# Patient Record
Sex: Female | Born: 1975
Health system: Southern US, Community
[De-identification: ages and names within clinical notes are randomized; demographics above are authoritative.]

## PROBLEM LIST (undated history)

## (undated) DIAGNOSIS — R51 Headache: Secondary | ICD-10-CM

## (undated) DIAGNOSIS — F32A Depression, unspecified: Secondary | ICD-10-CM

## (undated) DIAGNOSIS — F419 Anxiety disorder, unspecified: Secondary | ICD-10-CM

## (undated) DIAGNOSIS — R011 Cardiac murmur, unspecified: Secondary | ICD-10-CM

## (undated) DIAGNOSIS — F329 Major depressive disorder, single episode, unspecified: Secondary | ICD-10-CM

## (undated) DIAGNOSIS — IMO0001 Reserved for inherently not codable concepts without codable children: Secondary | ICD-10-CM

## (undated) DIAGNOSIS — D649 Anemia, unspecified: Secondary | ICD-10-CM

## (undated) DIAGNOSIS — N809 Endometriosis, unspecified: Secondary | ICD-10-CM

## (undated) DIAGNOSIS — T7840XA Allergy, unspecified, initial encounter: Secondary | ICD-10-CM

## (undated) DIAGNOSIS — R519 Headache, unspecified: Secondary | ICD-10-CM

## (undated) HISTORY — PX: WISDOM TOOTH EXTRACTION: SHX21

## (undated) HISTORY — PX: TUBAL LIGATION: SHX77

## (undated) HISTORY — PX: CHOLECYSTECTOMY: SHX55

## (undated) HISTORY — DX: Allergy, unspecified, initial encounter: T78.40XA

## (undated) HISTORY — PX: DIAGNOSTIC LAPAROSCOPY: SUR761

---

## 1999-03-05 ENCOUNTER — Other Ambulatory Visit: Admission: RE | Admit: 1999-03-05 | Discharge: 1999-03-05 | Payer: Self-pay | Admitting: Obstetrics and Gynecology

## 1999-05-23 ENCOUNTER — Inpatient Hospital Stay (HOSPITAL_COMMUNITY): Admission: AD | Admit: 1999-05-23 | Discharge: 1999-05-25 | Payer: Self-pay | Admitting: Obstetrics and Gynecology

## 1999-08-15 ENCOUNTER — Other Ambulatory Visit: Admission: RE | Admit: 1999-08-15 | Discharge: 1999-08-15 | Payer: Self-pay | Admitting: Obstetrics and Gynecology

## 2005-07-31 ENCOUNTER — Emergency Department (HOSPITAL_COMMUNITY): Admission: EM | Admit: 2005-07-31 | Discharge: 2005-07-31 | Payer: Self-pay | Admitting: Emergency Medicine

## 2006-07-04 ENCOUNTER — Encounter: Admission: RE | Admit: 2006-07-04 | Discharge: 2006-07-04 | Payer: Self-pay | Admitting: Cardiology

## 2006-08-14 ENCOUNTER — Ambulatory Visit (HOSPITAL_COMMUNITY): Admission: RE | Admit: 2006-08-14 | Discharge: 2006-08-14 | Payer: Self-pay | Admitting: General Surgery

## 2006-08-14 ENCOUNTER — Encounter (INDEPENDENT_AMBULATORY_CARE_PROVIDER_SITE_OTHER): Payer: Self-pay | Admitting: Specialist

## 2007-06-09 ENCOUNTER — Ambulatory Visit (HOSPITAL_COMMUNITY): Admission: RE | Admit: 2007-06-09 | Discharge: 2007-06-09 | Payer: Self-pay | Admitting: Obstetrics and Gynecology

## 2010-11-04 ENCOUNTER — Encounter: Payer: Self-pay | Admitting: Cardiology

## 2011-02-26 NOTE — Op Note (Signed)
Julie Dunn, Julie Dunn NO.:  [id number]   MEDICAL RECORD NO.:  [id number]          PATIENT TYPE:  AMB   LOCATION:  SDC                           FACILITY:  [facility]   PHYSICIAN:  Miguel Aschoff, M.D.       DATE OF BIRTH:  [birth year]   DATE OF PROCEDURE:  06/09/2007  DATE OF DISCHARGE:                               OPERATIVE REPORT   PREOPERATIVE DIAGNOSES:  Pelvic pain secondary to dysmenorrhea, desired  sterilization.   POSTOPERATIVE DIAGNOSES:  Pelvic pain secondary to dysmenorrhea, desired  sterilization, pelvic endometriosis.   PROCEDURE:  Diagnostic laparoscopy, laser ablation of endometriosis,  laser ablation of the uterosacral nerves followed by bipolar cautery of  fallopian tubes for sterilization.   SURGEON:  Dr. Miguel Aschoff.   ANESTHESIA:  General.   COMPLICATIONS:  None.   JUSTIFICATION:  The patient is a 35 year old, black female with a  history of progressive dysmenorrhea. The patient has requested that a  sterilization procedure be carried out at the time that a diagnostic  laparoscopy is performed to assess the etiology of her dysmenorrhea.  The risks and benefits of these procedures has been obtained and  informed consent has been obtained for sterilization.   DESCRIPTION OF PROCEDURE:  The patient was taken to the operating room,  placed in the supine position, general anesthesia was administered  without difficulty.  She was then placed in the dorsal lithotomy  position, prepped and draped in the usual sterile fashion.  The bladder  was catheterized.  At this point, a Hulka tenaculum was placed through  the cervix and held and then attention was directed to the umbilicus  where a small infraumbilical incision was made.  A Veress needle was  then inserted and the abdomen was insufflated with 3 liters of CO2.  Following the insufflation, the trocar to the laparoscope was placed  followed by the laparoscope itself.  Then under direct visualization,  a  second midline puncture was performed to allow a 5-mm port.  Once this  was established, systematic inspection of the pelvic organs was carried  out. The anterior bladder peritoneum was unremarkable.  The round  ligaments were inspected and were normal along their course.  There was  no sign of any inguinal hernias present.  The uterus was noted be  globular, top normal size, the surface was otherwise unremarkable.  At  this point, the tubes were traced along their course, they were normal  bilaterally and no adhesions were noted.  The fimbriae were fine and  delicate.  The ovaries appeared to be within normal limits.  A corpus  luteum was noted on the left ovary, no other abnormalities were noted.  Attention was then directed to the cul-de-sac. In the cul-de-sac deep  within the pelvis, there was a peritoneal window consistent with  endometriosis and within this window was an implanted endometriosis. In  addition, the uterosacral nerve on the left side appeared to be  thickened again consistent with endometriosis. No other implants were  noted.  No other abnormalities were noted in the pelvis.  The intestinal  surfaces were unremarkable.  The liver was inspected and appeared to be  within normal limits.  At this point, the YAG laser was passed through  the operating channel of the laparoscope and with a GRP6 and 15 watts of  power the endometriosis in the cul-de-sac was ablated without  difficulty. In effort to decrease the dysmenorrhea, the uterosacral  nerves were partially transected at their junction with the uterus.  Care was taken to avoid any injury to adjacent structures of the  ureters. This was done successfully.  After this was done, the bipolar  cautery forceps were introduced, the mid portion of each tube was then  grasped, cauterized for approximately 3 cm and then divided in the  midline. Over the area of cauterization was good separation and good  hemostasis. At this  point with no other abnormalities being noted and  the source of the dysmenorrhea confirmed, it was elected to complete the  procedure. CO2 was allowed to escape, all instruments were removed and  then the small incisions were closed using subcuticular 3-0 Vicryl.  The  port sites were then injected with a total of 10 mL of 1% Xylocaine.  The patient was then reversed from the anesthetic and taken to the  recovery room in satisfactory condition.  There was minimal blood loss  from the procedure.   The patient is to be discharged home.  Medications for home include  Tylox 1 every 3 hours as needed for pain, doxycycline 100 mg twice a day  x3 days.  The patient is to set a follow-up visit up for 4 weeks.  She  is to call for any problems such as fever, pain or heavy bleeding and it  was recommended that she call on August 28 to discuss the findings at  the time of laparoscopy.      Miguel Aschoff, M.D.  Electronically Signed     AR/MEDQ  D:  06/09/2007  T:  06/10/2007  Job:  [id number]

## 2011-03-01 NOTE — Op Note (Signed)
NAMEJAYNI, Julie Dunn NO.:  [id number]   MEDICAL RECORD NO.:  [id number]          PATIENT TYPE:  AMB   LOCATION:  SDS                          FACILITY:  [facility]   PHYSICIAN:  Leonie Man, M.D.   DATE OF BIRTH:  08/08/1976   DATE OF PROCEDURE:  08/14/2006  DATE OF DISCHARGE:                                 OPERATIVE REPORT   PREOPERATIVE DIAGNOSIS:  Chronic calculus cholecystitis.   POSTOPERATIVE DIAGNOSIS:  Chronic calculus cholecystitis.   PROCEDURE:  Laparoscopic cholecystectomy with intraoperative cholangiogram.   SURGEON:  Leonie Man, MD.   ASSISTANT:  Velora Heckler, MD.   ANESTHESIA:  General.   SPECIMENS TO THE LAB:  Gallbladder with stones.   ESTIMATED BLOOD LOSS:  Minimal.   COMPLICATIONS:  None.   DISPOSITION:  The patient returned to the PACU in excellent condition.   INDICATIONS:  This is a 35 year old female, who presents with a severe  episode of right upper quadrant abdominal pain associated with nausea and  with emesis.  She is evaluated by gallbladder ultrasonography and noted to  have sludge and multiple gallstones.  Her liver function studies are all  within normal limits.  She comes to the operating room now after the risks  and benefits of surgery have been fully discussed, all questions answered  and consent obtained.   DESCRIPTION OF PROCEDURE:  Following the induction of satisfactory general  anesthesia, the patient was positioned supine and the abdomen was prepped  and draped to be included in the sterile operative field.  Open laparoscopy  was created at the umbilicus with insertion of a Hasson cannula and  insufflation of the peritoneal cavity to 14 mmHg pressure using carbon  dioxide.  A camera was inserted and visual exploration of the abdomen  carried out.  The gallbladder was noted to be chronically scarred.  The  liver edges are sharp.  The liver surface is smooth.  None of the large or  small intestine appeared  to be abnormal.  The anterior gastric wall and the  duodenal sweep were also normal.  Under direct vision, epigastric and  lateral ports were placed.  The gallbladder was grasped and retracted  cephalad.  The dissection was carried down in the region of the ampulla to  allow the isolation of the cystic artery and cystic duct.  The cystic artery  was traced up to its entry into the gallbladder wall.  The cystic duct was  traced up to the cystic duct-gallbladder junction, and the common bile duct  could be clearly seen below.  The cystic duct was clipped proximally and  opened, and a cystic duct cholangiogram was carried out by placing a Cook  catheter into the abdomen and into the cystic duct.  The cholangiogram using  1/2 strength Hypaque was accomplished under fluoroscopic guidance.  The  resulting films showed a prompt flow of contrast into the duodenum, with  normal tapering of the distal common bile duct.  There were no filling  defects noted.  Both the right and left hepatic radicals appeared to be  normal, as  well as the caliber of the common and hepatic ducts.  The  cholangiocatheter was then removed and the cystic duct triply clipped and  transected.  Dissection was carried along the gallbladder, releasing the  gallbladder from the liver bed using electrocautery, and maintaining  hemostasis throughout the entire course of the dissection.  At the end of  the dissection, the gallbladder was placed in an Endopouch and retrieved  through the umbilical port without difficulty.  The right upper quadrant was  then thoroughly irrigated with normal saline and aspirated free.  All areas  of bleeding in the liver bed were treated with additional pulses of  electrocautery.  Sponge and instrument counts were then verified.  The  pneumoperitoneum was released after the lateral ports were removed under  direct vision.  The wounds were then closed in layers as follows: the  umbilical wound in 2  layers with 0 Vicryl and 4-0 Monocryl.  Epigastric and  flank wounds were closed with 4-0 Monocryl sutures.  All wounds were  reinforced with Steri-Strips.  Sterile dressings were applied, the  anesthetic reversed and the patient removed from the operating room to the  recovery room in stable condition.  She tolerated the procedure well.      Leonie Man, M.D.  Electronically Signed     PB/MEDQ  D:  08/14/2006  T:  08/15/2006  Job:  [id number]   cc:   Leonie Man, MD

## 2011-07-26 LAB — CBC
HCT: 35.2 — ABNORMAL LOW
Hemoglobin: 11.6 — ABNORMAL LOW
MCHC: 33
MCV: 78.8
Platelets: 488 — ABNORMAL HIGH
RBC: 4.47
RDW: 15.9 — ABNORMAL HIGH
WBC: 6.8

## 2011-07-26 LAB — PREGNANCY, URINE: Preg Test, Ur: NEGATIVE

## 2014-06-01 ENCOUNTER — Ambulatory Visit (INDEPENDENT_AMBULATORY_CARE_PROVIDER_SITE_OTHER): Payer: BC Managed Care – PPO | Admitting: Family Medicine

## 2014-06-01 VITALS — BP 100/60 | HR 67 | Temp 98.1°F | Resp 16 | Ht 63.25 in | Wt 204.0 lb

## 2014-06-01 DIAGNOSIS — J309 Allergic rhinitis, unspecified: Secondary | ICD-10-CM

## 2014-06-01 DIAGNOSIS — Z111 Encounter for screening for respiratory tuberculosis: Secondary | ICD-10-CM

## 2014-06-01 DIAGNOSIS — Z0000 Encounter for general adult medical examination without abnormal findings: Secondary | ICD-10-CM

## 2014-06-01 DIAGNOSIS — G44229 Chronic tension-type headache, not intractable: Secondary | ICD-10-CM

## 2014-06-01 DIAGNOSIS — J302 Other seasonal allergic rhinitis: Secondary | ICD-10-CM

## 2014-06-01 LAB — POCT CBC
Granulocyte percent: 47.8 %G (ref 37–80)
HCT, POC: 31.8 % — AB (ref 37.7–47.9)
Hemoglobin: 9.7 g/dL — AB (ref 12.2–16.2)
Lymph, poc: 3.2 (ref 0.6–3.4)
MCH, POC: 21.4 pg — AB (ref 27–31.2)
MCHC: 30.4 g/dL — AB (ref 31.8–35.4)
MCV: 70.3 fL — AB (ref 80–97)
MID (cbc): 0.4 (ref 0–0.9)
MPV: 6.4 fL (ref 0–99.8)
POC Granulocyte: 3.3 (ref 2–6.9)
POC LYMPH PERCENT: 46.5 %L (ref 10–50)
POC MID %: 5.7 %M (ref 0–12)
Platelet Count, POC: 508 10*3/uL — AB (ref 142–424)
RBC: 4.53 M/uL (ref 4.04–5.48)
RDW, POC: 18.8 %
WBC: 6.9 10*3/uL (ref 4.6–10.2)

## 2014-06-01 NOTE — Patient Instructions (Signed)
I will let you know the results of your labs  Advise regular exercise  Advise trying to eat less and losing a little weight  Return if problems arise

## 2014-06-01 NOTE — Progress Notes (Signed)

## 2014-06-01 NOTE — Progress Notes (Signed)
Employment physical exam:  History: 38 year old lady who is here for a physical examination. She has no major acute medical complaints.  Past medical history: Medications: None Allergies: None Medical illnesses: Seasonal allergies Surgical history: Cholecystectomy, tubal ligation, and laparoscopic endometriosis surgery Gravida 2 para 2  Family history: Mother is living and well except for history of hypertension. Father is deceased, history unknown.  Social history: Patient is married, has 2 daughters. Yesterday she put her daughter into the normal at [facility]. Patient will be in a [facility] [employer] [employer] 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 She does not smoke or use drugs. She does drink one or 2 drinks a week. Her only sexual partners her husband. She does not get regular exercise. She does have a college education from [employer].  Review of systems: Constitutional: Unremarkable, gets tired occasionally but it's probably from her lifestyle. HEENT: Unremarkable Respiratory: Unremarkable Cardiovascular: Unremarkable Gastrointestinal: Unremarkable Endocrine: Unremarkable Genitourinary: Unremarkable Musculoskeletal: Unremarkable Dermatologic: Unremarkable Allergy/immunology: Has seasonal allergies Neurological: Almost daily headaches, takes some Tylenol or Advil for them. Probably from her stressful lifestyle. Hematologic: Unremarkable Psychiatric: Unremarkable  Preventive history: Last Pap smear at 2012. Never had a mammogram. No EKGs, bone density, colonoscopy.   Physical exam: Overweight lady in no acute distress. TMs normal. Eyes PERRLA. Fundi benign. Discs flat. Throat was clear, missing some teeth. Neck supple without nodes thyromegaly. Chest clear to auscultation and percussion. No carotid bruits. No thyromegaly. Breasts are full, symmetrical, no masses. No axillary nodes. Abdomen soft without mass or tenderness. Has  a couple of old surgical scars from laparoscopic procedures. Extremities unremarkable. Skin unremarkable.  Assessment: Normal physical examination Headaches, probably muscle tension Seasonal allergies  Plan: CBC, comprehensive metabolic panel, lipids, TSH PPD skin test, to be read in 48-72 hours Form for job completed

## 2014-06-02 ENCOUNTER — Encounter: Payer: Self-pay | Admitting: *Deleted

## 2014-06-02 LAB — COMPLETE METABOLIC PANEL WITH GFR
ALT: 11 U/L (ref 0–35)
AST: 14 U/L (ref 0–37)
Albumin: 4.1 g/dL (ref 3.5–5.2)
Alkaline Phosphatase: 106 U/L (ref 39–117)
BUN: 7 mg/dL (ref 6–23)
CO2: 27 mEq/L (ref 19–32)
Calcium: 9.4 mg/dL (ref 8.4–10.5)
Chloride: 104 mEq/L (ref 96–112)
Creat: 0.86 mg/dL (ref 0.50–1.10)
GFR, Est African American: >89 mL/min
GFR, Est Non African American: 85 mL/min
Glucose, Bld: 89 mg/dL (ref 70–99)
Potassium: 4.7 mEq/L (ref 3.5–5.3)
Sodium: 137 mEq/L (ref 135–145)
Total Bilirubin: 0.4 mg/dL (ref 0.2–1.2)
Total Protein: 7.7 g/dL (ref 6.0–8.3)

## 2014-06-02 LAB — LIPID PANEL
Cholesterol: 172 mg/dL (ref 0–200)
HDL: 63 mg/dL (ref >39)
LDL Cholesterol: 98 mg/dL (ref 0–99)
Total CHOL/HDL Ratio: 2.7 Ratio
Triglycerides: 55 mg/dL (ref <150)
VLDL: 11 mg/dL (ref 0–40)

## 2014-06-02 LAB — TSH: TSH: 1.849 u[IU]/mL (ref 0.350–4.500)

## 2014-06-04 ENCOUNTER — Ambulatory Visit (INDEPENDENT_AMBULATORY_CARE_PROVIDER_SITE_OTHER): Payer: BC Managed Care – PPO

## 2014-06-04 DIAGNOSIS — Z111 Encounter for screening for respiratory tuberculosis: Secondary | ICD-10-CM

## 2014-06-04 LAB — TB SKIN TEST
Induration: 8 mm
TB Skin Test: NEGATIVE

## 2014-06-04 NOTE — Progress Notes (Signed)
   Subjective:    Patient ID: Julie Dunn, female    DOB: 1975/12/31, 38 y.o.   MRN: [id number]  HPI  Read TB test with Harrison Mons, PA-C. It was 8 mm induration. No risk factors. Negative.     Review of Systems     Objective:   Physical Exam        Assessment & Plan:

## 2014-08-23 ENCOUNTER — Ambulatory Visit (INDEPENDENT_AMBULATORY_CARE_PROVIDER_SITE_OTHER): Payer: BC Managed Care – PPO | Admitting: Family Medicine

## 2014-08-23 VITALS — BP 136/87 | HR 73 | Temp 98.6°F | Resp 20 | Ht 63.0 in | Wt 208.4 lb

## 2014-08-23 DIAGNOSIS — R109 Unspecified abdominal pain: Secondary | ICD-10-CM

## 2014-08-23 DIAGNOSIS — N390 Urinary tract infection, site not specified: Secondary | ICD-10-CM

## 2014-08-23 DIAGNOSIS — M549 Dorsalgia, unspecified: Secondary | ICD-10-CM

## 2014-08-23 DIAGNOSIS — N809 Endometriosis, unspecified: Secondary | ICD-10-CM

## 2014-08-23 LAB — POCT UA - MICROSCOPIC ONLY
Casts, Ur, LPF, POC: NEGATIVE
Crystals, Ur, HPF, POC: NEGATIVE
Mucus, UA: NEGATIVE
Yeast, UA: NEGATIVE

## 2014-08-23 LAB — POCT URINALYSIS DIPSTICK
Bilirubin, UA: NEGATIVE
Glucose, UA: NEGATIVE
Ketones, UA: NEGATIVE
Nitrite, UA: NEGATIVE
Protein, UA: NEGATIVE
Spec Grav, UA: 1.015
Urobilinogen, UA: 0.2
pH, UA: 7.5

## 2014-08-23 MED ORDER — CYCLOBENZAPRINE HCL 5 MG PO TABS
5.0000 mg | ORAL_TABLET | Freq: Every evening | ORAL | Status: DC | PRN
Start: 2014-08-23 — End: 2016-01-30

## 2014-08-23 MED ORDER — CEPHALEXIN 500 MG PO CAPS: 500.0000 mg | ORAL_CAPSULE | Freq: Two times a day (BID) | ORAL | Status: DC

## 2014-08-23 MED ORDER — HYDROCODONE-ACETAMINOPHEN 5-325 MG PO TABS: 1.0000 | ORAL_TABLET | Freq: Three times a day (TID) | ORAL | Status: DC | PRN

## 2014-08-23 NOTE — Patient Instructions (Signed)

## 2014-08-23 NOTE — Progress Notes (Signed)
Chief Complaint:  Chief Complaint  Patient presents with  . Flank Pain    left flank pain that is radiating around the left side.  hard to lay down.  did have some burning with urination today.      HPI: Julie Dunn is a 38 y.o. female who is here for  Left sided throbbing pain in the back, constant,wose layign dpwn, she has not had this before. She started having urinary sxs today dysuira. No fevers or chills. No UTI in the past.  She has nki for back. No heavy lifitn. She is supposed to starting her period. She has a hx of endometriosis. This feels little bit like her endometriosis , usually in the front. This feels different sicn ein the back.  Past Medical History  Diagnosis Date  . Allergy    Past Surgical History  Procedure Laterality Date  . Tubal ligation    . Gallbladder surgery     History   Social History  . Marital Status: Married    Spouse Name: N/A    Number of Children: N/A  . Years of Education: N/A   Social History Main Topics  . Smoking status: Never Smoker   . Smokeless tobacco: None  . Alcohol Use: None  . Drug Use: None  . Sexual Activity: None   Other Topics Concern  . None   Social History Narrative   Family History  Problem Relation Age of Onset  . Hypertension Mother   . Hypertension Maternal Grandmother    No Known Allergies Prior to Admission medications   Not on File   ROS: The patient denies fevers, chills, night sweats, unintentional weight loss, chest pain, palpitations, wheezing, dyspnea on exertion, nausea, vomiting, abdominal pain, dysuria, hematuria, melena, numbness, weakness, or tingling.   All other systems have been reviewed and were otherwise negative with the exception of those mentioned in the HPI and as above.    PHYSICAL EXAM: Filed Vitals:   08/23/14 2007  BP: 136/87  Pulse: 73  Temp: 98.6 F (37 C)  Resp: 20   Filed Vitals:   08/23/14 2007  Height: 5\' 3"  (1.6 m)  Weight: 208 lb 6.4 oz (94.53  kg)   Body mass index is 36.93 kg/(m^2).  General: Alert, no acute distress HEENT:  Normocephalic, atraumatic, oropharynx patent. EOMI, PERRLA Cardiovascular:  Regular rate and rhythm, no rubs murmurs or gallops.  No Carotid bruits, radial pulse intact. No pedal edema.  Respiratory: Clear to auscultation bilaterally.  No wheezes, rales, or rhonchi.  No cyanosis, no use of accessory musculature GI: No organomegaly, abdomen is soft and non-tender, positive bowel sounds.  No masses. Skin: No rashes. Neurologic: Facial musculature symmetric. Psychiatric: Patient is appropriate throughout our interaction. Lymphatic: No cervical lymphadenopathy Musculoskeletal: Gait intact. + left CVA tenderness Neg scoliosis  Full ROM 5/5 strength, 2/2 DTRs No saddle anesthesia Straight leg negative Hip and knee exam--normal    LABS: Results for orders placed or performed in visit on 08/23/14  POCT UA - Microscopic Only  Result Value Ref Range   WBC, Ur, HPF, POC 12-18 with small clusters    RBC, urine, microscopic 4-6    Bacteria, U Microscopic small    Mucus, UA neg    Epithelial cells, urine per micros 4-8    Crystals, Ur, HPF, POC neg    Casts, Ur, LPF, POC neg    Yeast, UA neg   POCT urinalysis dipstick  Result Value Ref Range  Color, UA yellow    Clarity, UA hazy    Glucose, UA neg    Bilirubin, UA neg    Ketones, UA neg    Spec Grav, UA 1.015    Blood, UA moderate    pH, UA 7.5    Protein, UA neg    Urobilinogen, UA 0.2    Nitrite, UA neg    Leukocytes, UA small (1+)      EKG/XRAY:   Primary read interpreted by Dr. Marin Comment at [facility].   ASSESSMENT/PLAN: Encounter Diagnoses  Name Primary?  . Left flank pain   . UTI (lower urinary tract infection) Yes  . Left-sided back pain, unspecified location   . Endometriosis    Keflex Flexeril Norco Urine cx pending  Gross sideeffects, risk and benefits, and alternatives of medications d/w patient. Patient is aware that all  medications have potential sideeffects and we are unable to predict every sideeffect or drug-drug interaction that may occur.  LE, [place], DO 08/23/2014 9:18 PM

## 2014-08-25 LAB — URINE CULTURE: Colony Count: 30000

## 2015-07-11 ENCOUNTER — Other Ambulatory Visit: Payer: Self-pay | Admitting: Obstetrics & Gynecology

## 2015-07-12 LAB — CYTOLOGY - PAP

## 2015-07-26 ENCOUNTER — Encounter (HOSPITAL_COMMUNITY): Payer: Self-pay

## 2015-12-26 ENCOUNTER — Other Ambulatory Visit: Payer: Self-pay | Admitting: Obstetrics and Gynecology

## 2016-01-29 NOTE — Patient Instructions (Signed)
Your procedure is scheduled on:  Wednesday, Feb 14, 2016  Enter through the [employer] [facility] at: 7:00 AM  Pick up the phone at the desk and dial [phone].  Call this number if you have problems the morning of surgery: [phone].  Remember:  Do NOT eat food or drink after:  Midnight Tuesday, Feb 13, 2016  Take these medicines the morning of surgery with a SIP OF WATER:  None  Do NOT wear jewelry (body piercing), metal hair clips/bobby pins, make-up, or nail polish. Do NOT wear lotions, powders, or perfumes.  You may wear deodorant. Do NOT shave for 48 hours prior to surgery. Do NOT bring valuables to the hospital. Contacts, dentures, or bridgework may not be worn into surgery.  Leave suitcase in car.  After surgery it may be brought to your room.  For patients admitted to the hospital, checkout time is 11:00 AM the day of discharge.

## 2016-01-30 ENCOUNTER — Encounter (HOSPITAL_COMMUNITY)
Admission: RE | Admit: 2016-01-30 | Discharge: 2016-01-30 | Disposition: A | Discharge status: Home / Self Care | Payer: BLUE CROSS/BLUE SHIELD | Source: Ambulatory Visit | Attending: Obstetrics and Gynecology | Admitting: Obstetrics and Gynecology

## 2016-01-30 ENCOUNTER — Other Ambulatory Visit: Payer: Self-pay | Admitting: Obstetrics and Gynecology

## 2016-01-30 ENCOUNTER — Encounter (HOSPITAL_COMMUNITY): Payer: Self-pay

## 2016-01-30 DIAGNOSIS — Z87891 Personal history of nicotine dependence: Secondary | ICD-10-CM | POA: Diagnosis not present

## 2016-01-30 DIAGNOSIS — Z01812 Encounter for preprocedural laboratory examination: Secondary | ICD-10-CM | POA: Insufficient documentation

## 2016-01-30 DIAGNOSIS — N920 Excessive and frequent menstruation with regular cycle: Secondary | ICD-10-CM | POA: Diagnosis not present

## 2016-01-30 DIAGNOSIS — R102 Pelvic and perineal pain: Secondary | ICD-10-CM | POA: Diagnosis not present

## 2016-01-30 DIAGNOSIS — N946 Dysmenorrhea, unspecified: Secondary | ICD-10-CM | POA: Insufficient documentation

## 2016-01-30 DIAGNOSIS — F419 Anxiety disorder, unspecified: Secondary | ICD-10-CM | POA: Diagnosis not present

## 2016-01-30 DIAGNOSIS — D649 Anemia, unspecified: Secondary | ICD-10-CM | POA: Insufficient documentation

## 2016-01-30 DIAGNOSIS — F329 Major depressive disorder, single episode, unspecified: Secondary | ICD-10-CM | POA: Insufficient documentation

## 2016-01-30 HISTORY — DX: Headache: R51

## 2016-01-30 HISTORY — DX: Major depressive disorder, single episode, unspecified: F32.9

## 2016-01-30 HISTORY — DX: Depression, unspecified: F32.A

## 2016-01-30 HISTORY — DX: Anxiety disorder, unspecified: F41.9

## 2016-01-30 HISTORY — DX: Cardiac murmur, unspecified: R01.1

## 2016-01-30 HISTORY — DX: Reserved for inherently not codable concepts without codable children: IMO0001

## 2016-01-30 HISTORY — DX: Headache, unspecified: R51.9

## 2016-01-30 HISTORY — DX: Anemia, unspecified: D64.9

## 2016-01-30 LAB — CBC
HCT: 34.3 % — ABNORMAL LOW (ref 36.0–46.0)
Hemoglobin: 11.2 g/dL — ABNORMAL LOW (ref 12.0–15.0)
MCH: 23.7 pg — ABNORMAL LOW (ref 26.0–34.0)
MCHC: 32.7 g/dL (ref 30.0–36.0)
MCV: 72.7 fL — ABNORMAL LOW (ref 78.0–100.0)
Platelets: 500 10*3/uL — ABNORMAL HIGH (ref 150–400)
RBC: 4.72 MIL/uL (ref 3.87–5.11)
RDW: 16.9 % — ABNORMAL HIGH (ref 11.5–15.5)
WBC: 8.6 10*3/uL (ref 4.0–10.5)

## 2016-02-02 ENCOUNTER — Other Ambulatory Visit: Payer: Self-pay | Admitting: Obstetrics and Gynecology

## 2016-02-13 NOTE — H&P (Signed)
  Admission History and Physical Exam for a Gynecology Patient  [name] is a 40 y.o. female, P 1-1-2-2, who presents for laparoscopy and vaginal hysterectomy. She has been followed at the [facility] division of [facility]. The patient complains of menorrhagia and pelvic pain.  She has a history of endometriosis.  Her endometrial biopsy is negative.  Gonorrhea and Chlamydia cultures are negative.  Medical management has not relieved her discomfort.  She is status post laparoscopy and status post tubal sterilization.  She wants to proceed with definitive therapy.  OB History    No data available      Past Medical History  Diagnosis Date  . Allergy   . Heart murmur     childhood  . Shortness of breath dyspnea     former smoker  . Anxiety   . Depression   . Headache     Migraines  . Anemia     No prescriptions prior to admission    Past Surgical History  Procedure Laterality Date  . Tubal ligation    . Gallbladder surgery    . Cholecystectomy    . Diagnostic laparoscopy    . Wisdom tooth extraction      No Known Allergies  Family History: family history includes Hypertension in her maternal grandmother and mother.  Social History:  reports that she quit smoking about 9 years ago. Her smoking use included Cigarettes. She has a 3.75 pack-year smoking history. She has never used smokeless tobacco. She reports that she drinks alcohol. She reports that she does not use illicit drugs.  Review of systems: See HPI.  Admission Physical Exam:    BMI equals 39.5.  There were no vitals taken for this visit.  HEENT:                 Within normal limits Chest:                   Clear Heart:                    Regular rate and rhythm Breasts:                No masses, skin changes, bleeding, or discharge present Abdomen:             Nontender, no masses Extremities:          Grossly normal Neurologic exam: Grossly  normal  Pelvic exam:  External genitalia: normal general appearance Vaginal: cystocele Cervix: normal appearance Adnexa: normal bimanual exam Uterus: upper limits normal size  Assessment:  Menorrhagia  Dysmenorrhea  Intravitreal doses  BMI equals 39.5  Anxiety  Plan:  The patient will undergo laparoscopy and a laparoscopy assisted vaginal hysterectomy.  She will also have a salpingectomy.  She understands the indications for her surgical procedure.  She accepts the risks of, but not limited to, anesthetic complications, bleeding, infections, and possible damage to the surrounding organs.   Eli Hose 02/13/2016

## 2016-02-14 ENCOUNTER — Observation Stay (HOSPITAL_COMMUNITY)
Admission: RE | Admit: 2016-02-14 | Discharge: 2016-02-15 | Disposition: A | Discharge status: Home / Self Care | Payer: BLUE CROSS/BLUE SHIELD | Source: Ambulatory Visit | Attending: Obstetrics and Gynecology | Admitting: Obstetrics and Gynecology

## 2016-02-14 ENCOUNTER — Encounter (HOSPITAL_COMMUNITY)
Admission: RE | Disposition: A | Discharge status: Home / Self Care | Payer: Self-pay | Source: Ambulatory Visit | Attending: Obstetrics and Gynecology

## 2016-02-14 ENCOUNTER — Ambulatory Visit (HOSPITAL_COMMUNITY): Payer: BLUE CROSS/BLUE SHIELD | Admitting: Anesthesiology

## 2016-02-14 ENCOUNTER — Encounter (HOSPITAL_COMMUNITY): Payer: Self-pay | Admitting: Anesthesiology

## 2016-02-14 DIAGNOSIS — N946 Dysmenorrhea, unspecified: Secondary | ICD-10-CM | POA: Diagnosis not present

## 2016-02-14 DIAGNOSIS — F418 Other specified anxiety disorders: Secondary | ICD-10-CM | POA: Diagnosis not present

## 2016-02-14 DIAGNOSIS — Z87891 Personal history of nicotine dependence: Secondary | ICD-10-CM | POA: Diagnosis not present

## 2016-02-14 DIAGNOSIS — D252 Subserosal leiomyoma of uterus: Secondary | ICD-10-CM | POA: Diagnosis not present

## 2016-02-14 DIAGNOSIS — R102 Pelvic and perineal pain: Secondary | ICD-10-CM | POA: Insufficient documentation

## 2016-02-14 DIAGNOSIS — Z6839 Body mass index (BMI) 39.0-39.9, adult: Secondary | ICD-10-CM | POA: Insufficient documentation

## 2016-02-14 DIAGNOSIS — N920 Excessive and frequent menstruation with regular cycle: Principal | ICD-10-CM | POA: Insufficient documentation

## 2016-02-14 DIAGNOSIS — D259 Leiomyoma of uterus, unspecified: Secondary | ICD-10-CM

## 2016-02-14 DIAGNOSIS — N800 Endometriosis of uterus: Secondary | ICD-10-CM | POA: Insufficient documentation

## 2016-02-14 DIAGNOSIS — D251 Intramural leiomyoma of uterus: Secondary | ICD-10-CM | POA: Insufficient documentation

## 2016-02-14 DIAGNOSIS — R319 Hematuria, unspecified: Secondary | ICD-10-CM | POA: Insufficient documentation

## 2016-02-14 DIAGNOSIS — D649 Anemia, unspecified: Secondary | ICD-10-CM | POA: Diagnosis not present

## 2016-02-14 DIAGNOSIS — N924 Excessive bleeding in the premenopausal period: Secondary | ICD-10-CM

## 2016-02-14 HISTORY — PX: LAPAROSCOPIC VAGINAL HYSTERECTOMY WITH SALPINGECTOMY: SHX6680

## 2016-02-14 HISTORY — PX: CYSTOSCOPY: SHX5120

## 2016-02-14 LAB — PREGNANCY, URINE: Preg Test, Ur: NEGATIVE

## 2016-02-14 SURGERY — HYSTERECTOMY, VAGINAL, LAPAROSCOPY-ASSISTED, WITH SALPINGECTOMY
Anesthesia: General

## 2016-02-14 MED ORDER — IBUPROFEN 600 MG PO TABS: 600.0000 mg | ORAL_TABLET | Freq: Four times a day (QID) | ORAL | Status: DC | PRN

## 2016-02-14 MED ORDER — MENTHOL 3 MG MT LOZG: 1.0000 | LOZENGE | OROMUCOSAL | Status: DC | PRN

## 2016-02-14 MED ORDER — PROPOFOL 10 MG/ML IV BOLUS
INTRAVENOUS | Status: AC
  Filled 2016-02-14: qty 20

## 2016-02-14 MED ORDER — MEPERIDINE HCL 25 MG/ML IJ SOLN: 6.2500 mg | INTRAMUSCULAR | Status: DC | PRN

## 2016-02-14 MED ORDER — KETOROLAC TROMETHAMINE 30 MG/ML IJ SOLN
INTRAMUSCULAR | Status: DC | PRN
  Administered 2016-02-14: 30 mg via INTRAVENOUS

## 2016-02-14 MED ORDER — SODIUM CHLORIDE 0.9% FLUSH: 9.0000 mL | INTRAVENOUS | Status: DC | PRN

## 2016-02-14 MED ORDER — ONDANSETRON HCL 4 MG PO TABS: 4.0000 mg | ORAL_TABLET | Freq: Three times a day (TID) | ORAL | Status: DC | PRN

## 2016-02-14 MED ORDER — DEXAMETHASONE SODIUM PHOSPHATE 10 MG/ML IJ SOLN
INTRAMUSCULAR | Status: AC
  Filled 2016-02-14: qty 1

## 2016-02-14 MED ORDER — FENTANYL CITRATE (PF) 250 MCG/5ML IJ SOLN
INTRAMUSCULAR | Status: AC
  Filled 2016-02-14: qty 5

## 2016-02-14 MED ORDER — BUPIVACAINE-EPINEPHRINE 0.5% -1:200000 IJ SOLN
INTRAMUSCULAR | Status: DC | PRN
  Administered 2016-02-14: 6 mL
  Administered 2016-02-14: 5 mL

## 2016-02-14 MED ORDER — FENTANYL CITRATE (PF) 250 MCG/5ML IJ SOLN
INTRAMUSCULAR | Status: DC | PRN
  Administered 2016-02-14 (×3): 50 ug via INTRAVENOUS
  Administered 2016-02-14: 100 ug via INTRAVENOUS
  Administered 2016-02-14 (×2): 50 ug via INTRAVENOUS

## 2016-02-14 MED ORDER — SCOPOLAMINE 1 MG/3DAYS TD PT72
MEDICATED_PATCH | TRANSDERMAL | Status: AC
  Filled 2016-02-14: qty 1

## 2016-02-14 MED ORDER — NALOXONE HCL 0.4 MG/ML IJ SOLN: 0.4000 mg | INTRAMUSCULAR | Status: DC | PRN

## 2016-02-14 MED ORDER — MIDAZOLAM HCL 2 MG/2ML IJ SOLN
INTRAMUSCULAR | Status: DC | PRN
  Administered 2016-02-14: 2 mg via INTRAVENOUS

## 2016-02-14 MED ORDER — DIPHENHYDRAMINE HCL 12.5 MG/5ML PO ELIX: 12.5000 mg | ORAL_SOLUTION | Freq: Four times a day (QID) | ORAL | Status: DC | PRN

## 2016-02-14 MED ORDER — LIDOCAINE HCL (CARDIAC) 20 MG/ML IV SOLN
INTRAVENOUS | Status: DC | PRN
  Administered 2016-02-14: 90 mg via INTRAVENOUS

## 2016-02-14 MED ORDER — ONDANSETRON HCL 4 MG/2ML IJ SOLN
INTRAMUSCULAR | Status: AC
  Filled 2016-02-14: qty 2

## 2016-02-14 MED ORDER — ROCURONIUM BROMIDE 100 MG/10ML IV SOLN
INTRAVENOUS | Status: AC
  Filled 2016-02-14: qty 1

## 2016-02-14 MED ORDER — SCOPOLAMINE 1 MG/3DAYS TD PT72
1.0000 | MEDICATED_PATCH | Freq: Once | TRANSDERMAL | Status: DC
  Administered 2016-02-14: 1.5 mg via TRANSDERMAL

## 2016-02-14 MED ORDER — ONDANSETRON HCL 4 MG/2ML IJ SOLN
INTRAMUSCULAR | Status: DC | PRN
  Administered 2016-02-14: 4 mg via INTRAVENOUS

## 2016-02-14 MED ORDER — CEFAZOLIN SODIUM-DEXTROSE 2-3 GM-% IV SOLR
INTRAVENOUS | Status: AC
  Administered 2016-02-14: 2 g via INTRAVENOUS
  Filled 2016-02-14: qty 50

## 2016-02-14 MED ORDER — LACTATED RINGERS IV SOLN
INTRAVENOUS | Status: DC
Start: 2016-02-14 — End: 2016-02-14
  Administered 2016-02-14: 09:00:00 via INTRAVENOUS
  Administered 2016-02-14: 125 mL/h via INTRAVENOUS
  Administered 2016-02-14: 10:00:00 via INTRAVENOUS

## 2016-02-14 MED ORDER — DEXAMETHASONE SODIUM PHOSPHATE 4 MG/ML IJ SOLN
INTRAMUSCULAR | Status: DC | PRN
  Administered 2016-02-14: 10 mg via INTRAVENOUS

## 2016-02-14 MED ORDER — BUPIVACAINE-EPINEPHRINE (PF) 0.5% -1:200000 IJ SOLN
INTRAMUSCULAR | Status: AC
  Filled 2016-02-14: qty 30

## 2016-02-14 MED ORDER — SODIUM CHLORIDE 0.9 % IJ SOLN
INTRAMUSCULAR | Status: AC
  Filled 2016-02-14: qty 10

## 2016-02-14 MED ORDER — DEXAMETHASONE SODIUM PHOSPHATE 4 MG/ML IJ SOLN
INTRAMUSCULAR | Status: AC
  Filled 2016-02-14: qty 1

## 2016-02-14 MED ORDER — HYDROMORPHONE HCL 1 MG/ML IJ SOLN
INTRAMUSCULAR | Status: AC
  Filled 2016-02-14: qty 1

## 2016-02-14 MED ORDER — ONDANSETRON HCL 4 MG/2ML IJ SOLN: 4.0000 mg | Freq: Once | INTRAMUSCULAR | Status: DC | PRN

## 2016-02-14 MED ORDER — PROPOFOL 10 MG/ML IV BOLUS
INTRAVENOUS | Status: DC | PRN
  Administered 2016-02-14: 200 mg via INTRAVENOUS

## 2016-02-14 MED ORDER — HYDROMORPHONE 1 MG/ML IV SOLN
INTRAVENOUS | Status: DC
  Administered 2016-02-14: 15:00:00 via INTRAVENOUS
  Administered 2016-02-14: 0.6 mg via INTRAVENOUS
  Administered 2016-02-14: 0.3 mg via INTRAVENOUS
  Administered 2016-02-15: 0.6 mg via INTRAVENOUS
  Administered 2016-02-15: 0.9 mg via INTRAVENOUS
  Filled 2016-02-14: qty 25

## 2016-02-14 MED ORDER — NEOSTIGMINE METHYLSULFATE 10 MG/10ML IV SOLN
INTRAVENOUS | Status: DC | PRN
  Administered 2016-02-14: 3 mg via INTRAVENOUS

## 2016-02-14 MED ORDER — ROCURONIUM BROMIDE 100 MG/10ML IV SOLN
INTRAVENOUS | Status: DC | PRN
  Administered 2016-02-14: 40 mg via INTRAVENOUS

## 2016-02-14 MED ORDER — MIDAZOLAM HCL 2 MG/2ML IJ SOLN
INTRAMUSCULAR | Status: AC
  Filled 2016-02-14: qty 2

## 2016-02-14 MED ORDER — BUPIVACAINE LIPOSOME 1.3 % IJ SUSP
20.0000 mL | Freq: Once | INTRAMUSCULAR | Status: AC
  Administered 2016-02-14: 40 mL
  Filled 2016-02-14: qty 20

## 2016-02-14 MED ORDER — VASOPRESSIN 20 UNIT/ML IV SOLN
INTRAVENOUS | Status: AC
  Filled 2016-02-14: qty 1

## 2016-02-14 MED ORDER — HYDROMORPHONE HCL 1 MG/ML IJ SOLN
INTRAMUSCULAR | Status: DC | PRN
  Administered 2016-02-14 (×2): 1 mg via INTRAVENOUS

## 2016-02-14 MED ORDER — LACTATED RINGERS IV SOLN
INTRAVENOUS | Status: DC
  Administered 2016-02-14 – 2016-02-15 (×3): via INTRAVENOUS

## 2016-02-14 MED ORDER — DOCUSATE SODIUM 100 MG PO CAPS
100.0000 mg | ORAL_CAPSULE | Freq: Two times a day (BID) | ORAL | Status: DC
  Administered 2016-02-15: 100 mg via ORAL
  Filled 2016-02-14 (×2): qty 1

## 2016-02-14 MED ORDER — LIDOCAINE HCL (CARDIAC) 20 MG/ML IV SOLN
INTRAVENOUS | Status: AC
  Filled 2016-02-14: qty 5

## 2016-02-14 MED ORDER — GLYCOPYRROLATE 0.2 MG/ML IJ SOLN
INTRAMUSCULAR | Status: AC
  Filled 2016-02-14: qty 2

## 2016-02-14 MED ORDER — DIPHENHYDRAMINE HCL 50 MG/ML IJ SOLN: 12.5000 mg | Freq: Four times a day (QID) | INTRAMUSCULAR | Status: DC | PRN

## 2016-02-14 MED ORDER — ONDANSETRON HCL 4 MG/2ML IJ SOLN
4.0000 mg | Freq: Four times a day (QID) | INTRAMUSCULAR | Status: DC | PRN
  Administered 2016-02-14: 4 mg via INTRAVENOUS
  Filled 2016-02-14: qty 2

## 2016-02-14 MED ORDER — NEOSTIGMINE METHYLSULFATE 10 MG/10ML IV SOLN
INTRAVENOUS | Status: AC
  Filled 2016-02-14: qty 1

## 2016-02-14 MED ORDER — GLYCOPYRROLATE 0.2 MG/ML IJ SOLN
INTRAMUSCULAR | Status: AC
  Filled 2016-02-14: qty 1

## 2016-02-14 MED ORDER — KETOROLAC TROMETHAMINE 30 MG/ML IJ SOLN: 30.0000 mg | Freq: Once | INTRAMUSCULAR | Status: DC

## 2016-02-14 MED ORDER — SODIUM CHLORIDE 0.9 % IJ SOLN
INTRAMUSCULAR | Status: AC
  Filled 2016-02-14: qty 20

## 2016-02-14 MED ORDER — METHYLENE BLUE 0.5 % INJ SOLN
INTRAVENOUS | Status: DC | PRN
  Administered 2016-02-14: 50 mg via INTRAVENOUS

## 2016-02-14 MED ORDER — GLYCOPYRROLATE 0.2 MG/ML IJ SOLN
INTRAMUSCULAR | Status: DC | PRN
  Administered 2016-02-14: 0.6 mg via INTRAVENOUS
  Administered 2016-02-14: 0.1 mg via INTRAVENOUS

## 2016-02-14 MED ORDER — KETOROLAC TROMETHAMINE 30 MG/ML IJ SOLN
30.0000 mg | Freq: Four times a day (QID) | INTRAMUSCULAR | Status: AC
  Administered 2016-02-14 – 2016-02-15 (×3): 30 mg via INTRAVENOUS
  Filled 2016-02-14 (×3): qty 1

## 2016-02-14 MED ORDER — VASOPRESSIN 20 UNIT/ML IV SOLN
INTRAVENOUS | Status: DC | PRN
  Administered 2016-02-14: 20 mL via INTRAMUSCULAR
  Administered 2016-02-14: 09:00:00 via INTRAMUSCULAR

## 2016-02-14 MED ORDER — KETOROLAC TROMETHAMINE 30 MG/ML IJ SOLN
INTRAMUSCULAR | Status: AC
  Filled 2016-02-14: qty 1

## 2016-02-14 MED ORDER — FENTANYL CITRATE (PF) 100 MCG/2ML IJ SOLN
INTRAMUSCULAR | Status: AC
  Filled 2016-02-14: qty 2

## 2016-02-14 MED ORDER — METHYLENE BLUE 1 % INJ SOLN
INTRAMUSCULAR | Status: AC
  Filled 2016-02-14: qty 10

## 2016-02-14 MED ORDER — SODIUM CHLORIDE 0.9 % IJ SOLN
INTRAMUSCULAR | Status: AC
  Filled 2016-02-14: qty 100

## 2016-02-14 MED ORDER — CEFAZOLIN SODIUM 1 G IJ SOLR
2.0000 g | INTRAMUSCULAR | Status: DC
  Filled 2016-02-14: qty 20

## 2016-02-14 MED ORDER — OXYCODONE-ACETAMINOPHEN 5-325 MG PO TABS: 1.0000 | ORAL_TABLET | ORAL | Status: DC | PRN

## 2016-02-14 MED ORDER — HYDROMORPHONE HCL 1 MG/ML IJ SOLN: 0.2500 mg | INTRAMUSCULAR | Status: DC | PRN

## 2016-02-14 SURGICAL SUPPLY — 51 items
CABLE HIGH FREQUENCY MONO STRZ (ELECTRODE) IMPLANT
CATH ROBINSON RED A/P 16FR (CATHETERS) IMPLANT
CLOTH BEACON ORANGE TIMEOUT ST (SAFETY) ×3 IMPLANT
CONT PATH 16OZ SNAP LID 3702 (MISCELLANEOUS) ×3 IMPLANT
COVER BACK TABLE 60X90IN (DRAPES) ×3 IMPLANT
DECANTER SPIKE VIAL GLASS SM (MISCELLANEOUS) ×5 IMPLANT
DRAPE SHEET LG 3/4 BI-LAMINATE (DRAPES) ×6 IMPLANT
DRSG COVADERM PLUS 2X2 (GAUZE/BANDAGES/DRESSINGS) ×6 IMPLANT
DRSG OPSITE POSTOP 3X4 (GAUZE/BANDAGES/DRESSINGS) ×1 IMPLANT
DURAPREP 26ML APPLICATOR (WOUND CARE) ×3 IMPLANT
ELECT REM PT RETURN 9FT ADLT (ELECTROSURGICAL) ×3
ELECTRODE REM PT RTRN 9FT ADLT (ELECTROSURGICAL) ×2 IMPLANT
GAUZE SPONGE 4X4 16PLY XRAY LF (GAUZE/BANDAGES/DRESSINGS) ×3 IMPLANT
GAUZE VASELINE 3X9 (GAUZE/BANDAGES/DRESSINGS) IMPLANT
GLOVE BIOGEL PI IND STRL 6.5 (GLOVE) IMPLANT
GLOVE BIOGEL PI IND STRL 7.0 (GLOVE) ×6 IMPLANT
GLOVE BIOGEL PI IND STRL 8.5 (GLOVE) ×2 IMPLANT
GLOVE BIOGEL PI INDICATOR 6.5 (GLOVE)
GLOVE BIOGEL PI INDICATOR 7.0 (GLOVE) ×3
GLOVE BIOGEL PI INDICATOR 8.5 (GLOVE) ×1
GLOVE ECLIPSE 8.0 STRL XLNG CF (GLOVE) ×12 IMPLANT
LEGGING LITHOTOMY PAIR STRL (DRAPES) ×3 IMPLANT
NDL MAYO CATGUT SZ4 TPR NDL (NEEDLE) ×2 IMPLANT
NEEDLE MAYO CATGUT SZ4 (NEEDLE) ×3 IMPLANT
NS IRRIG 1000ML POUR BTL (IV SOLUTION) ×3 IMPLANT
PACK LAVH (CUSTOM PROCEDURE TRAY) ×3 IMPLANT
PACK ROBOTIC GOWN (GOWN DISPOSABLE) ×3 IMPLANT
PAD TRENDELENBURG POSITION (MISCELLANEOUS) ×3 IMPLANT
PEN SKIN MARKING STD PT W/RULR (MISCELLANEOUS) ×1 IMPLANT
PENCIL BUTTON HOLSTER BLD 10FT (ELECTRODE) ×3 IMPLANT
SET IRRIG TUBING LAPAROSCOPIC (IRRIGATION / IRRIGATOR) IMPLANT
SLEEVE XCEL OPT CAN 5 100 (ENDOMECHANICALS) ×3 IMPLANT
SOLUTION ELECTROLUBE (MISCELLANEOUS) ×3 IMPLANT
STRIP CLOSURE SKIN 1/4X3 (GAUZE/BANDAGES/DRESSINGS) IMPLANT
SUT CHROMIC 1 CT1 27 (SUTURE) IMPLANT
SUT MNCRL AB 3-0 PS2 27 (SUTURE) ×3 IMPLANT
SUT VIC AB 0 CT1 18XCR BRD8 (SUTURE) ×6 IMPLANT
SUT VIC AB 0 CT1 27 (SUTURE) ×12
SUT VIC AB 0 CT1 27XBRD ANBCTR (SUTURE) ×6 IMPLANT
SUT VIC AB 0 CT1 36 (SUTURE) ×3 IMPLANT
SUT VIC AB 0 CT1 8-18 (SUTURE) ×9
SUT VIC AB 2-0 SH 27 (SUTURE) ×3
SUT VIC AB 2-0 SH 27XBRD (SUTURE) ×2 IMPLANT
SUT VICRYL 0 ENDOLOOP (SUTURE) IMPLANT
SUT VICRYL 0 TIES 12 18 (SUTURE) ×3 IMPLANT
SUT VICRYL 0 UR6 27IN ABS (SUTURE) ×6 IMPLANT
SYR 50ML LL SCALE MARK (SYRINGE) ×3 IMPLANT
TOWEL OR 17X24 6PK STRL BLUE (TOWEL DISPOSABLE) ×6 IMPLANT
TRAY FOLEY CATH SILVER 14FR (SET/KITS/TRAYS/PACK) ×3 IMPLANT
WARMER LAPAROSCOPE (MISCELLANEOUS) ×3 IMPLANT
WATER STERILE IRR 1000ML POUR (IV SOLUTION) ×3 IMPLANT

## 2016-02-14 NOTE — Transfer of Care (Signed)
Immediate Anesthesia Transfer of Care Note  Patient: Julie Dunn  Procedure(s) Performed: Procedure(s): LAPAROSCOPIC ASSISTED VAGINAL HYSTERECTOMY WITH BILATERAL SALPINGECTOMY (Bilateral) CYSTOSCOPY (N/A)  Patient Location: PACU  Anesthesia Type:General  Level of Consciousness: awake, alert  and oriented  Airway & Oxygen Therapy: Patient Spontanous Breathing and Patient connected to nasal cannula oxygen  Post-op Assessment: Report given to RN and Post -op Vital signs reviewed and stable  Post vital signs: Reviewed and stable  Last Vitals:  Filed Vitals:   02/14/16 0712  BP: 134/91  Pulse: 73  Temp: 36.9 C  Resp: 20    Last Pain:  Filed Vitals:   02/14/16 0714  PainSc: 6       Patients Stated Pain Goal: 3 ([id number])  Complications: No apparent anesthesia complications

## 2016-02-14 NOTE — Anesthesia Postprocedure Evaluation (Signed)
Anesthesia Post Note  Patient: Julie Dunn  Procedure(s) Performed: Procedure(s) (LRB): LAPAROSCOPIC ASSISTED VAGINAL HYSTERECTOMY WITH BILATERAL SALPINGECTOMY (Bilateral) CYSTOSCOPY (N/A)  Patient location during evaluation: [facility] Anesthesia Type: General Level of consciousness: awake and awake and alert Pain management: pain level controlled Vital Signs Assessment: post-procedure vital signs reviewed and stable Respiratory status: spontaneous breathing and patient connected to nasal cannula oxygen Cardiovascular status: stable Anesthetic complications: no     Last Vitals:  Filed Vitals:   02/14/16 1437 02/14/16 1532  BP: 144/74 155/87  Pulse: 55 72  Temp: 37.1 C 36.4 C  Resp: 16 13    Last Pain:  Filed Vitals:   02/14/16 1534  PainSc: 5    Pain Goal: Patients Stated Pain Goal: 2 (02/14/16 1500)               Elzabeth Mcquerry Hristova

## 2016-02-14 NOTE — Progress Notes (Signed)
The patient was interviewed and examined today.  The previously documented history and physical examination was reviewed. There are no changes. The operative procedure was reviewed. The risks and benefits were outlined again. The specific risks include, but are not limited to, anesthetic complications, bleeding, infections, and possible damage to the surrounding organs. The patient's questions were answered.  We are ready to proceed as outlined. The likelihood of the patient achieving the goals of this procedure is very likely.   CBC    Component Value Date/Time   WBC 8.6 01/30/2016 1115   WBC 6.9 06/01/2014 1320   RBC 4.72 01/30/2016 1115   RBC 4.53 06/01/2014 1320   HGB 11.2* 01/30/2016 1115   HGB 9.7* 06/01/2014 1320   HCT 34.3* 01/30/2016 1115   HCT 31.8* 06/01/2014 1320   PLT 500* 01/30/2016 1115   MCV 72.7* 01/30/2016 1115   MCV 70.3* 06/01/2014 1320   MCH 23.7* 01/30/2016 1115   MCH 21.4* 06/01/2014 1320   MCHC 32.7 01/30/2016 1115   MCHC 30.4* 06/01/2014 1320   RDW 16.9* 01/30/2016 1115    BP 134/91 mmHg  Pulse 73  Temp(Src) 98.4 F (36.9 C) (Oral)  Resp 20  SpO2 100% Results for orders placed or performed during the hospital encounter of 02/14/16 (from the past 24 hour(s))  Pregnancy, urine     Status: None   Collection Time: 02/14/16  7:00 AM  Result Value Ref Range   Preg Test, Ur NEGATIVE NEGATIVE     Gildardo Cranker, M.D.

## 2016-02-14 NOTE — Op Note (Signed)
OPERATIVE NOTE  Julie Dunn  DOB:    10-Jul-1976  MRN:    [id number]  CSN:    [id number]  Date of Surgery:  02/14/2016  Preoperative Diagnosis:  Menorrhagia  Anemia  Dysmenorrhea  Fibroid uterus  Endometriosis  BMI equals 39.5  Anxiety  Postoperative Diagnosis:  Same  Hematuria  Procedure:  Laparoscopy Assisted Vaginal Hysterectomy  Bilateral salpingectomy  Cystoscopy  Surgeon:  Gildardo Cranker, M.D.  Assistant:  Earnstine Regal, PA-C  Anesthetic:  General  Disposition:  The patient presents with the above-mentioned diagnosis. She understands the indications for surgical procedure.  She also understands the alternative treatment options. She accepts the risk of, but not limited to, anesthetic complications, bleeding, infections, and possible damage to the surrounding organs.  Findings:  The patient had a multi-fibroid uterus that was approximately 10 weeks size. The fallopian tubes were normal except for defects from her prior tubal sterilization. Her ovaries appeared normal. There was a "peritoneal window" in the posterior cul-de-sac that was consistent with her past history of endometriosis. The appendix and the bowel appeared normal. The upper abdomen appeared normal. She has had a prior cholecystectomy. The patient was noted to have hematuria at the end of her procedure. Cystoscopy was performed. Blue dye quickly passed through both ureteral orifices. There was no evidence of damage to the bladder mucosa and there was no evidence of suture material in the bladder. No lesions were seen in the bladder. The estimated blood loss was 300 cc.  Procedure:  The patient was taken to the operating room where a general anesthetic was given. The patient's  abdomen was prepped with ChloraPrep. The perineum and vagina were prepped with multiple layers of Betadine. A Foley catheter was placed in the bladder. An examination under anesthesia was performed. A Hulka  tenaculum was placed inside the uterus. The patient was sterilely draped. The subumbilical area was injected with half percent Marcaine with epinephrine. An incision was made in the incision was extended through the subcutaneous tissue and the fascia. The Hassan cannula was sutured into place. A pneumoperitoneum was obtained. The laparoscope was inserted. Operative findings are mentioned above. We will believe that we could safely proceed with vaginal surgery at this time. The patient was placed in a more lithotomy position. A weighted speculum was placed in the posterior vagina. The cervix was injected with half percent Marcaine with epinephrine. A circumferential incision was made around the cervix. The vaginal mucosa was advanced anteriorly and posteriorly. The anterior cul-de-sac and in the posterior cul-de-sac were sharply entered. Alternating from right to left the uterosacral ligaments, paracervical tissues, parametrial tissues, and uterine arteries were clamped, cut, sutured, and tied securely. The uterus was inverted through the posterior colpotomy. The upper pedicles were clamped, cut, and tied.  The uterus was removed from the operative field. Hemostasis was confirmed. The left fallopian tube was isolated, clamped, cut, and then 2 free ties were placed on the mesosalpinx. Hemostasis was adequate. An identical procedure was carried out on the opposite side. Bleeding was encountered. Hemostasis was achieved using figure-of-eight sutures. The sutures attached to the uterosacral ligaments were brought out through the vaginal angles and then tied securely. A McCall culdoplasty suture was placed in the posterior cul-de-sac incorporating the uterosacral ligaments bilaterally and the posterior peritoneum. A final check was made for hemostasis and again hemostasis was confirmed. Exparel was injected into the suture lines. The vaginal cuff was closed using figure-of-eight sutures incorporating the anterior vaginal  mucosa, the anterior  peritoneum, posterior peritoneum, and the posterior vaginal mucosa. The McCall culdoplasty suture was tied securely and the apex of the vagina was noted to elevate into the midpelvis. Exparel was injected into the vaginal cuff. The operator then changed gown and gloves. The pneumoperitoneum was reestablished. The pelvis was inspected and hemostasis was adequate. We felt that we are ready to end the procedure. The pneumoperitoneum was allowed to escape. The subumbilical trocar was removed. The subumbilical incision was closed using a deep suture of 0 Vicryl followed by skin closures of 3-0 Monocryl. It was at this point that the patient was noted to have hematuria. The patient was returned to the lithotomy position. The perineum was prepped with Betadine. The patient was sterilely draped. The Foley catheter was removed. The cystoscope was inserted. The ureteral orifices were noted to be patent. The bladder mucosa was carefully inspected and there was no evidence of bleeding, or pathology. No suture material was visible within the bladder. The cystoscope was removed and a Foley catheter was reinserted. The patient tolerated her procedure well. She was awakened from her anesthetic without difficulty and then transported to the recovery room in stable condition. Sponge, needle, and instrument counts were correct on 2 occasions. The estimated blood loss was 300 cc's. 0 Vicryl is the suture material used throughout the procedure. The uterus was sent to pathology.   Gildardo Cranker, M.D.  02/14/2016

## 2016-02-14 NOTE — OR Nursing (Signed)
Blood noted in foley bag. Dr.Stringer called to bedside. Cystoscopy to be done.

## 2016-02-14 NOTE — Anesthesia Procedure Notes (Signed)
Procedure Name: Intubation Date/Time: 02/14/2016 8:33 AM Performed by: Flossie Dibble Pre-anesthesia Checklist: Patient being monitored, Suction available, Emergency Drugs available, Patient identified and Timeout performed Patient Re-evaluated:Patient Re-evaluated prior to inductionOxygen Delivery Method: Circle system utilized Preoxygenation: Pre-oxygenation with 100% oxygen Intubation Type: IV induction Ventilation: Mask ventilation without difficulty and Oral airway inserted - appropriate to patient size Laryngoscope Size: Mac and 3 Grade View: Grade I Tube type: Oral Tube size: 7.0 mm Number of attempts: 1 Airway Equipment and Method: Patient positioned with wedge pillow and Stylet Placement Confirmation: ETT inserted through vocal cords under direct vision,  positive ETCO2 and breath sounds checked- equal and bilateral Secured at: 21.5 cm Tube secured with: Tape Dental Injury: Teeth and Oropharynx as per pre-operative assessment

## 2016-02-14 NOTE — Anesthesia Postprocedure Evaluation (Signed)
Anesthesia Post Note  Patient: Julie Dunn  Procedure(s) Performed: Procedure(s) (LRB): LAPAROSCOPIC ASSISTED VAGINAL HYSTERECTOMY WITH BILATERAL SALPINGECTOMY (Bilateral) CYSTOSCOPY (N/A)  Patient location during evaluation: PACU Anesthesia Type: General Level of consciousness: awake and sedated Pain management: pain level controlled Vital Signs Assessment: post-procedure vital signs reviewed and stable Respiratory status: spontaneous breathing Cardiovascular status: stable Postop Assessment: no signs of nausea or vomiting Anesthetic complications: no     Last Vitals:  Filed Vitals:   02/14/16 1245 02/14/16 1300  BP: 116/70 109/70  Pulse: 57 56  Temp: 36.6 C   Resp: 11 11    Last Pain:  Filed Vitals:   02/14/16 1303  PainSc: 0-No pain   Pain Goal: Patients Stated Pain Goal: 3 (02/14/16 0712)               Malka Bocek JR,JOHN Mateo Flow

## 2016-02-14 NOTE — Addendum Note (Signed)
Addendum  created 02/14/16 1612 by Hewitt Blade, CRNA   Modules edited: Clinical Notes   Clinical Notes:  File: [id number]

## 2016-02-14 NOTE — Anesthesia Preprocedure Evaluation (Addendum)
Anesthesia Evaluation  Patient identified by MRN, date of birth, ID band Patient awake    Reviewed: Allergy & Precautions, H&P , NPO status , Patient's Chart, lab work & pertinent test results  Airway Mallampati: II  TM Distance: >3 FB Neck ROM: full    Dental no notable dental hx.    Pulmonary former smoker,    Pulmonary exam normal        Cardiovascular negative cardio ROS Normal cardiovascular exam     Neuro/Psych    GI/Hepatic negative GI ROS, Neg liver ROS,   Endo/Other  Morbid obesity  Renal/GU negative Renal ROS     Musculoskeletal   Abdominal (+) + obese,   Peds  Hematology negative hematology ROS (+)   Anesthesia Other Findings   Reproductive/Obstetrics                            Anesthesia Physical Anesthesia Plan  ASA: III  Anesthesia Plan: General   Post-op Pain Management:    Induction: Intravenous  Airway Management Planned: Oral ETT  Additional Equipment:   Intra-op Plan:   Post-operative Plan: Extubation in OR  Informed Consent: I have reviewed the patients History and Physical, chart, labs and discussed the procedure including the risks, benefits and alternatives for the proposed anesthesia with the patient or authorized representative who has indicated his/her understanding and acceptance.     Plan Discussed with: CRNA and Surgeon  Anesthesia Plan Comments:        Anesthesia Quick Evaluation

## 2016-02-14 NOTE — Progress Notes (Signed)
POD 0 Vag Hys  Subjective: Patient reports tolerating PO.    Objective: I have reviewed patient's vital signs and intake and output.  General: alert and no distress Resp: clear to auscultation bilaterally Cardio: regular rate and rhythm, S1, S2 normal, no murmur, click, rub or gallop GI: soft, non-tender; bowel sounds normal; no masses,  no organomegaly Extremities: extremities normal, atraumatic, no cyanosis or edema Vaginal Bleeding: minimal   Assessment/Plan: Doing Well  Advance Diet  Plan to discharge home in the morning.    Eli Hose 02/14/2016, 6:43 PM

## 2016-02-15 ENCOUNTER — Encounter (HOSPITAL_COMMUNITY): Payer: Self-pay | Admitting: Obstetrics and Gynecology

## 2016-02-15 DIAGNOSIS — N920 Excessive and frequent menstruation with regular cycle: Secondary | ICD-10-CM | POA: Diagnosis not present

## 2016-02-15 LAB — CBC
HCT: 27.5 % — ABNORMAL LOW (ref 36.0–46.0)
HEMOGLOBIN: 8.7 g/dL — AB (ref 12.0–15.0)
MCH: 22.9 pg — ABNORMAL LOW (ref 26.0–34.0)
MCHC: 31.6 g/dL (ref 30.0–36.0)
MCV: 72.4 fL — ABNORMAL LOW (ref 78.0–100.0)
PLATELETS: 438 10*3/uL — AB (ref 150–400)
RBC: 3.8 MIL/uL — ABNORMAL LOW (ref 3.87–5.11)
RDW: 16.5 % — AB (ref 11.5–15.5)
WBC: 14.3 10*3/uL — ABNORMAL HIGH (ref 4.0–10.5)

## 2016-02-15 MED ORDER — ONDANSETRON HCL 4 MG PO TABS
4.0000 mg | ORAL_TABLET | Freq: Three times a day (TID) | ORAL | Status: DC | PRN
Start: 2016-02-15 — End: 2017-08-20

## 2016-02-15 MED ORDER — OXYCODONE-ACETAMINOPHEN 5-325 MG PO TABS: 1.0000 | ORAL_TABLET | ORAL | Status: DC | PRN

## 2016-02-15 MED ORDER — IBUPROFEN 600 MG PO TABS: ORAL_TABLET | ORAL | Status: DC

## 2016-02-15 NOTE — Progress Notes (Signed)
Julie Dunn is a57 y.o.  [id number]  Post Op Date # 1:  Laparoscopy/TVH/BS  Subjective: Patient is Doing well postoperatively. Patient has Pain is controlled with current analgesics. Medications being used: prescription NSAID's including Ketorolac 30 mg and narcotic analgesics including Hydrocodone.. Only a brief episode of lightheadedness upon standing that resolved with ambulation.  Tolerating liquids but hasn't voided since Foley was removed.   Objective: Vital signs in last 24 hours: Temp:  [97.5 F (36.4 C)-98.7 F (37.1 C)] 98.4 F (36.9 C) (05/04 0518) Pulse Rate:  [55-85] 79 (05/04 0518) Resp:  [8-20] 20 (05/04 0518) BP: (104-155)/(57-87) 119/69 mmHg (05/04 0518) SpO2:  [93 %-100 %] 98 % (05/04 0518) FiO2 (%):  [59 %-73 %] 73 % (05/04 0209) Weight:  [223 lb 4 oz (101.266 kg)] 223 lb 4 oz (101.266 kg) (05/03 1500)  Intake/Output from previous day: 05/03 0701 - 05/04 0700 In: 4020 [P.O.:220; I.V.:3800] Out: 3150 [Urine:2850] Intake/Output this shift:    Recent Labs Lab 02/15/16 0527  WBC 14.3*  HGB 8.7*  HCT 27.5*  PLT 438*    No results for input(s): NA, K, CL, CO2, BUN, CREATININE, CALCIUM, PROT, BILITOT, ALKPHOS, ALT, AST, GLUCOSE in the last 168 hours.  Invalid input(s): LABALBU  EXAM: General: alert, cooperative and no distress Resp: clear to auscultation bilaterally Cardio: regular rate and rhythm, S1, S2 normal, no murmur, click, rub or gallop GI: Bowel sounds present;  Umbilical incision dressing with moderate dry stain but intact. Extremities: Homans sign is negative, no sign of DVT and no calf tenderness.   Assessment: s/p Procedure(s): LAPAROSCOPIC ASSISTED VAGINAL HYSTERECTOMY WITH BILATERAL SALPINGECTOMY CYSTOSCOPY: stable, progressing well and anemia  Plan: Advance diet Encourage ambulation Advance to PO medication Awaiting the patient to void  Consider discharge home later today    [place], PA-C 02/15/2016 7:51 AM

## 2016-02-15 NOTE — Discharge Summary (Signed)
Physician Discharge Summary  Patient ID: Julie Dunn MRN: [id number] DOB/AGE: 04-16-1976 40 y.o.  Admit date: 02/14/2016 Discharge date: 02/15/2016   Discharge Diagnoses: Symptomatic Uterine Fibroids,  Menorrhagia and Dysmenorrhea,BMI 39.5, Hematuria, Anxiety Active Problems:   Fibroid, uterine   Operation: Laparoscopy, Bilateral Salpingectomy and Total Vaginal Hysterectomy, Cystoscopy  Discharged Condition: Good  Hospital Course: On the date of admission,  the patient underwent the aforementioned procedures and tolerated them well.  Operative findings included a multifibroid 12 weeks size uterus. Cystoscopy was performed because of hematuria. The findings were normal. The hematuria quickly resolved. Post operative course was unremarkable with the patient tolerating a hemoglobin of 8.7 (pre-op 11.2) and resuming bowel and bladder function by post operative day #1.  Having received the maximum benefit of her hospital stay,  the patient was deemed ready for discharge on post operative day #1.   Disposition:   Discharge Medications:    Medication List    STOP taking these medications        traMADol 50 MG tablet  Commonly known as:  ULTRAM      TAKE these medications        ALPRAZolam 0.25 MG tablet  Commonly known as:  XANAX  Take 0.25 mg by mouth at bedtime as needed for anxiety.     ibuprofen 600 MG tablet  Commonly known as:  ADVIL,MOTRIN  1 po pc every 6 hours for 5 days then as needed for pain     ondansetron 4 MG tablet  Commonly known as:  ZOFRAN  Take 1 tablet (4 mg total) by mouth every 8 (eight) hours as needed for nausea or vomiting.     oxyCODONE-acetaminophen 5-325 MG tablet  Commonly known as:  PERCOCET/ROXICET  Take 1-2 tablets by mouth every 4 (four) hours as needed for severe pain (moderate to severe pain (when tolerating fluids)).           Follow-up: Dr. Eli Hose on March 14, 2016 at 1:45 p.m.  SignedEarnstine Regal, PA-C 02/15/2016,  8:46 AM

## 2016-02-15 NOTE — Discharge Instructions (Signed)
Call [facility] @ [phone] if:  You have a temperature greater than or equal to 100.4 degrees Farenheit orally You have pain that is not made better by the pain medication given and taken as directed You have excessive bleeding or problems urinating  Take Colace (Docusate Sodium/Stool Softener) 100 mg 2-3 times daily while taking narcotic pain medicine to avoid constipation or until bowel movements are regular. Take an over the counter iron supplement of your choice twice a day for the next 12 weeks Take Ibuprofen as directed with food for the next 5 days then as needed for pain  You may drive after 2  weeks You may walk up steps  You may shower  You may resume a regular diet  Keep incisions clean and dry (remove honeycomb dressing on Feb 20, 2016) Do not lift over 15 pounds for 6 weeks Avoid anything in vagina for 6 weeks (or until after your post-operative visit)  Keep post operative appointment with Dr. Raphael Gibney on March 14, 2016 at 1:45 p.m.

## 2016-07-08 ENCOUNTER — Ambulatory Visit (INDEPENDENT_AMBULATORY_CARE_PROVIDER_SITE_OTHER): Payer: Self-pay | Admitting: Family Medicine

## 2016-07-08 DIAGNOSIS — Z111 Encounter for screening for respiratory tuberculosis: Secondary | ICD-10-CM

## 2016-07-11 LAB — TB SKIN TEST
INDURATION: 0 mm
TB SKIN TEST: NEGATIVE

## 2016-11-13 DIAGNOSIS — Z01419 Encounter for gynecological examination (general) (routine) without abnormal findings: Secondary | ICD-10-CM | POA: Diagnosis not present

## 2016-11-13 DIAGNOSIS — Z1231 Encounter for screening mammogram for malignant neoplasm of breast: Secondary | ICD-10-CM | POA: Diagnosis not present

## 2016-11-25 ENCOUNTER — Other Ambulatory Visit: Payer: Self-pay | Admitting: Obstetrics and Gynecology

## 2016-11-25 DIAGNOSIS — R1030 Lower abdominal pain, unspecified: Secondary | ICD-10-CM | POA: Diagnosis not present

## 2016-11-25 DIAGNOSIS — N760 Acute vaginitis: Secondary | ICD-10-CM | POA: Diagnosis not present

## 2017-08-20 ENCOUNTER — Ambulatory Visit: Payer: 59 | Admitting: Adult Health

## 2017-08-20 ENCOUNTER — Encounter: Payer: Self-pay | Admitting: Adult Health

## 2017-08-20 VITALS — BP 154/106 | Temp 98.4°F | Ht 63.25 in | Wt 232.0 lb

## 2017-08-20 DIAGNOSIS — Z7689 Persons encountering health services in other specified circumstances: Secondary | ICD-10-CM

## 2017-08-20 DIAGNOSIS — Z23 Encounter for immunization: Secondary | ICD-10-CM | POA: Diagnosis not present

## 2017-08-20 DIAGNOSIS — G43001 Migraine without aura, not intractable, with status migrainosus: Secondary | ICD-10-CM | POA: Diagnosis not present

## 2017-08-20 MED ORDER — AMITRIPTYLINE HCL 10 MG PO TABS: 10.0000 mg | ORAL_TABLET | Freq: Every day | ORAL | 1 refills | Status: DC

## 2017-08-20 NOTE — Progress Notes (Signed)
Patient presents to clinic today to establish care. She is a pleasant 41 year old female who  has a past medical history of Allergy, Anemia, Anxiety, Depression, Headache, Heart murmur, and Shortness of breath dyspnea.   She is due for a CPE   Acute Concerns: Anxiety and depression - She feels as though this is well controlled without medication.   Chronic Issues: Migraines - she reports that she has migraines about 50 percent of the days. She takes Motrin TID for her migraines but helps, but she has been experiencing stomach pain from taking Motrin. She has never been ona daily medication for her migraines   Health Maintenance: Dental -- Does not do on routine visit  Vision -- Routine Visit  Immunizations -- Due for flu shot  Colonoscopy -- Never had  Mammogram -- UTD  PAP -- No longer needed  Diet: She has been working on changing her diet.  Exercise: She walks about 30 -45 minutes three times per week   Wt Readings from Last 3 Encounters:  08/20/17 232 lb (105.2 kg)  02/14/16 223 lb 4 oz (101.3 kg)  01/30/16 223 lb 4 oz (101.3 kg)    She is followed by:  GYN - Dr. Raphael Gibney    Past Medical History:  Diagnosis Date  . Allergy   . Anemia   . Anxiety   . Depression   . Headache    Migraines  . Heart murmur    childhood  . Shortness of breath dyspnea    former smoker    Past Surgical History:  Procedure Laterality Date  . CHOLECYSTECTOMY    . DIAGNOSTIC LAPAROSCOPY    . GALLBLADDER SURGERY    . TUBAL LIGATION    . WISDOM TOOTH EXTRACTION      Current Outpatient Medications on File Prior to Visit  Medication Sig Dispense Refill  . ibuprofen (ADVIL,MOTRIN) 600 MG tablet 1 po pc every 6 hours for 5 days then as needed for pain 30 tablet 1  . ALPRAZolam (XANAX) 0.25 MG tablet Take 0.25 mg by mouth at bedtime as needed for anxiety.     No current facility-administered medications on file prior to visit.     No Known Allergies  Family History  Problem  Relation Age of Onset  . Hypertension Mother   . Hypertension Maternal Grandmother     Social History   Socioeconomic History  . Marital status: Married    Spouse name: Not on file  . Number of children: Not on file  . Years of education: Not on file  . Highest education level: Not on file  Social Needs  . Financial resource strain: Not on file  . Food insecurity - worry: Not on file  . Food insecurity - inability: Not on file  . Transportation needs - medical: Not on file  . Transportation needs - non-medical: Not on file  Occupational History  . Not on file  Tobacco Use  . Smoking status: Former Smoker    Packs/day: 0.25    Years: 15.00    Pack years: 3.75    Types: Cigarettes    Last attempt to quit: 01/30/2007    Years since quitting: 10.5  . Smokeless tobacco: Never Used  Substance and Sexual Activity  . Alcohol use: Yes    Alcohol/week: 0.0 oz  . Drug use: No  . Sexual activity: Yes    Birth control/protection: Surgical  Other Topics Concern  . Not on file  Social History  Narrative  . Not on file    Review of Systems  Constitutional: Negative.   HENT: Negative.   Respiratory: Negative.   Cardiovascular: Negative.   Gastrointestinal: Positive for abdominal pain and heartburn.  Genitourinary: Negative.   Musculoskeletal: Negative.   Skin: Negative.   Neurological: Negative.   Psychiatric/Behavioral: Negative.   All other systems reviewed and are negative.   Temp 98.4 F (36.9 C) (Oral)   Ht 5' 3.25" (1.607 m)   Wt 232 lb (105.2 kg)   LMP 01/05/2016 (Approximate)   BMI 40.77 kg/m   Physical Exam  Constitutional: She is oriented to person, place, and time and well-developed, well-nourished, and in no distress. No distress.  Cardiovascular: Normal rate, regular rhythm, normal heart sounds and intact distal pulses. Exam reveals no gallop.  No murmur heard. Pulmonary/Chest: Effort normal and breath sounds normal. No respiratory distress. She has no  wheezes. She has no rales. She exhibits no tenderness.  Neurological: She is alert and oriented to person, place, and time. Gait normal. GCS score is 15.  Skin: Skin is warm and dry. No rash noted. She is not diaphoretic. No erythema. No pallor.  Psychiatric: Mood, memory, affect and judgment normal.  Nursing note and vitals reviewed.  Assessment/Plan:  1. Encounter to establish care - Follow up in one month for CPE or sooner if needed - Continue to diet and exercise  - She can take OTC prilosec for acid reflux. This is likely from Motrin use   2. Need for influenza vaccination  - Flu Vaccine QUAD 6+ mos PF IM (Fluarix Quad PF)  3. Migraine without aura and with status migrainosus, not intractable - Will trial her on Elavil 10 mg. She is going to follow up in one month or sooner if needed - amitriptyline (ELAVIL) 10 MG tablet; Take 1 tablet (10 mg total) at bedtime by mouth.  Dispense: 90 tablet; Refill: 1  Dorothyann Peng, NP

## 2017-08-20 NOTE — Patient Instructions (Signed)
It was great meeting you today   I have sent in Amitriptyline to help with your chronic migraines   Please follow up with me in one month for your next physical

## 2017-09-19 ENCOUNTER — Encounter: Payer: Self-pay | Admitting: Adult Health

## 2017-09-19 ENCOUNTER — Ambulatory Visit: Payer: 59 | Admitting: Adult Health

## 2017-09-19 DIAGNOSIS — G43001 Migraine without aura, not intractable, with status migrainosus: Secondary | ICD-10-CM | POA: Diagnosis not present

## 2017-09-19 DIAGNOSIS — G43909 Migraine, unspecified, not intractable, without status migrainosus: Secondary | ICD-10-CM | POA: Insufficient documentation

## 2017-09-19 MED ORDER — IBUPROFEN 600 MG PO TABS: ORAL_TABLET | ORAL | 1 refills | Status: DC

## 2017-09-19 NOTE — Progress Notes (Signed)
Subjective:    Patient ID: Julie Dunn, female    DOB: 1976-03-09, 41 y.o.   MRN: [id number]  HPI 41 year old female who  has a past medical history of Allergy, Anemia, Anxiety, Depression, Headache, Heart murmur, and Shortness of breath dyspnea. She presents to the office today for follow up of migraines. During her last visit, a month ago she was started on amitriptyline 50 mg for migraine prophylaxis as she was experiencing a migraine about 50 percent of the days.    Today in the office she reports that she has noticed significant improvement in her headaches. She is currently headache free 75% of her days. She is sleeping better and has been able to have more energy to work out.   Unfortunately, she has noticed an increase in the amount of anxiety she is experiencing since starting Elavil. She feels as though it can be partially caused by work stress.    Review of Systems  Constitutional: Negative.   Respiratory: Negative.   Genitourinary: Negative.   Neurological: Negative.   Psychiatric/Behavioral: The patient is nervous/anxious.   All other systems reviewed and are negative.  Past Medical History:  Diagnosis Date  . Allergy   . Anemia   . Anxiety   . Depression   . Headache    Migraines  . Heart murmur    childhood  . Shortness of breath dyspnea    former smoker    Social History   Socioeconomic History  . Marital status: Married    Spouse name: Not on file  . Number of children: Not on file  . Years of education: Not on file  . Highest education level: Not on file  Social Needs  . Financial resource strain: Not on file  . Food insecurity - worry: Not on file  . Food insecurity - inability: Not on file  . Transportation needs - medical: Not on file  . Transportation needs - non-medical: Not on file  Occupational History  . Not on file  Tobacco Use  . Smoking status: Former Smoker    Packs/day: 0.25    Years: 15.00    Pack years: 3.75    Types:  Cigarettes    Last attempt to quit: 01/30/2007    Years since quitting: 10.6  . Smokeless tobacco: Never Used  Substance and Sexual Activity  . Alcohol use: Yes    Alcohol/week: 0.0 oz  . Drug use: No  . Sexual activity: Yes    Birth control/protection: Surgical  Other Topics Concern  . Not on file  Social History Narrative  . Not on file    Past Surgical History:  Procedure Laterality Date  . CHOLECYSTECTOMY    . CYSTOSCOPY N/A 02/14/2016   Procedure: CYSTOSCOPY;  Surgeon: Ena Dawley, MD;  Location: [facility];  Service: Gynecology;  Laterality: N/A;  . DIAGNOSTIC LAPAROSCOPY    . LAPAROSCOPIC VAGINAL HYSTERECTOMY WITH SALPINGECTOMY Bilateral 02/14/2016   Procedure: LAPAROSCOPIC ASSISTED VAGINAL HYSTERECTOMY WITH BILATERAL SALPINGECTOMY;  Surgeon: Ena Dawley, MD;  Location: [facility];  Service: Gynecology;  Laterality: Bilateral;  . TUBAL LIGATION    . WISDOM TOOTH EXTRACTION      Family History  Problem Relation Age of Onset  . Hypertension Mother   . Hypertension Maternal Grandmother     No Known Allergies  Current Outpatient Medications on File Prior to Visit  Medication Sig Dispense Refill  . amitriptyline (ELAVIL) 10 MG tablet Take 1 tablet (10 mg total) at bedtime by  mouth. 90 tablet 1   No current facility-administered medications on file prior to visit.     BP (!) 142/96 (BP Location: Left Arm)   Temp 98.7 F (37.1 C) (Oral)   Ht 5' 3.25" (1.607 m)   Wt 229 lb (103.9 kg)   LMP 01/05/2016 (Approximate)   BMI 40.25 kg/m       Objective:   Physical Exam  Constitutional: She is oriented to person, place, and time. She appears well-developed and well-nourished. No distress.  Cardiovascular: Normal rate, regular rhythm, normal heart sounds and intact distal pulses. Exam reveals no friction rub.  No murmur heard. Neurological: She is alert and oriented to person, place, and time.  Skin: Skin is warm and dry. No rash noted. She is not diaphoretic. No  erythema. No pallor.  Psychiatric: She has a normal mood and affect. Her behavior is normal. Judgment and thought content normal.  Nursing note and vitals reviewed.     Assessment & Plan:  1. Migraine without aura and with status migrainosus, not intractable - We spoke about how anxiety can be a side effect of Elavil. She feels good on this medication and does not want to come off it at this time. She would like to continue with the current regimen and we will see how she is doing at her physical   Dorothyann Peng, NP

## 2017-10-01 ENCOUNTER — Telehealth: Payer: Self-pay | Admitting: Adult Health

## 2017-10-01 MED ORDER — IBUPROFEN 600 MG PO TABS: ORAL_TABLET | ORAL | 1 refills | Status: DC

## 2017-10-01 NOTE — Telephone Encounter (Signed)
Reviewed chart. Seen that a prescription was printed on 09/19/17.  Called and spoke to W[facility] at [facility].  Pt did NOT have refills on file.  Informed Estill Bamberg that I will send over via e-scribe.  Will send in as [facility] prescribed with no changes.

## 2017-10-01 NOTE — Telephone Encounter (Signed)
Copied from [place] [phone]. Topic: Quick Communication - See Telephone Encounter >> Oct 01, 2017  8:47 AM Synthia Innocent wrote: CRM for notification. See Telephone encounter for:  Patient was seen on 09/19/17, provider was suppose to send in prescription for ibuprofen (ADVIL,MOTRIN) 600 MG tablet, [facility] [address] never received. Can this please be resent? 10/01/17.

## 2017-10-15 ENCOUNTER — Encounter: Payer: 59 | Admitting: Adult Health

## 2017-10-15 DIAGNOSIS — Z0289 Encounter for other administrative examinations: Secondary | ICD-10-CM

## 2017-10-15 NOTE — Progress Notes (Deleted)
Subjective:    Patient ID: Julie Dunn, female    DOB: 10/22/1975, 42 y.o.   MRN: [id number]  HPI  Patient presents for yearly preventative medicine examination. She is a pleasant 42 year old female who  has a past medical history of Allergy, Anemia, Anxiety, Depression, Headache, Heart murmur, and Shortness of breath dyspnea.  Migraine Headaches - Has started on Elavil and has noticed a significant decrease in the amount of migraine headaches she gets.   Anxiety/Depression   All immunizations and health maintenance protocols were reviewed with the patient and needed orders were placed.  Appropriate screening laboratory values were ordered for the patient including screening of hyperlipidemia, renal function and hepatic function.  Medication reconciliation,  past medical history, social history, problem list and allergies were reviewed in detail with the patient  Goals were established with regard to weight loss, exercise, and  diet in compliance with medications.She has been working on changing her diet and she walks about 30 -45 minutes three times per week  Wt Readings from Last 3 Encounters:  09/19/17 229 lb (103.9 kg)  08/20/17 232 lb (105.2 kg)  02/14/16 223 lb 4 oz (101.3 kg)   She has a history of hysterectomy. Is UTD on mammograms and does home breast exams, she has not found any abnormalities. She does do routine vision screens but does not see a dentist on a regular basis.    Review of Systems  Constitutional: Negative.   HENT: Negative.   Eyes: Negative.   Respiratory: Negative.   Cardiovascular: Negative.   Gastrointestinal: Negative.   Endocrine: Negative.   Genitourinary: Negative.   Musculoskeletal: Negative.   Skin: Negative.   Allergic/Immunologic: Negative.   Neurological: Positive for headaches.  Hematological: Negative.   Psychiatric/Behavioral: The patient is nervous/anxious.    Past Medical History:  Diagnosis Date  . Allergy   . Anemia   .  Anxiety   . Depression   . Headache    Migraines  . Heart murmur    childhood  . Shortness of breath dyspnea    former smoker    Social History   Socioeconomic History  . Marital status: Married    Spouse name: Not on file  . Number of children: Not on file  . Years of education: Not on file  . Highest education level: Not on file  Social Needs  . Financial resource strain: Not on file  . Food insecurity - worry: Not on file  . Food insecurity - inability: Not on file  . Transportation needs - medical: Not on file  . Transportation needs - non-medical: Not on file  Occupational History  . Not on file  Tobacco Use  . Smoking status: Former Smoker    Packs/day: 0.25    Years: 15.00    Pack years: 3.75    Types: Cigarettes    Last attempt to quit: 01/30/2007    Years since quitting: 10.7  . Smokeless tobacco: Never Used  Substance and Sexual Activity  . Alcohol use: Yes    Alcohol/week: 0.0 oz  . Drug use: No  . Sexual activity: Yes    Birth control/protection: Surgical  Other Topics Concern  . Not on file  Social History Narrative  . Not on file    Past Surgical History:  Procedure Laterality Date  . CHOLECYSTECTOMY    . CYSTOSCOPY N/A 02/14/2016   Procedure: CYSTOSCOPY;  Surgeon: Ena Dawley, MD;  Location: [facility];  Service: Gynecology;  Laterality:  N/A;  . DIAGNOSTIC LAPAROSCOPY    . LAPAROSCOPIC VAGINAL HYSTERECTOMY WITH SALPINGECTOMY Bilateral 02/14/2016   Procedure: LAPAROSCOPIC ASSISTED VAGINAL HYSTERECTOMY WITH BILATERAL SALPINGECTOMY;  Surgeon: Ena Dawley, MD;  Location: [facility];  Service: Gynecology;  Laterality: Bilateral;  . TUBAL LIGATION    . WISDOM TOOTH EXTRACTION      Family History  Problem Relation Age of Onset  . Hypertension Mother   . Hypertension Maternal Grandmother     No Known Allergies  Current Outpatient Medications on File Prior to Visit  Medication Sig Dispense Refill  . amitriptyline (ELAVIL) 10 MG tablet Take 1  tablet (10 mg total) at bedtime by mouth. 90 tablet 1  . ibuprofen (ADVIL,MOTRIN) 600 MG tablet 1 po pc every 6 hours for 5 days then as needed for pain 90 tablet 1   No current facility-administered medications on file prior to visit.     LMP 01/05/2016 (Approximate)       Objective:   Physical Exam  Constitutional: She is oriented to person, place, and time. She appears well-developed and well-nourished. No distress.  HENT:  Head: Normocephalic and atraumatic.  Right Ear: External ear normal.  Left Ear: External ear normal.  Nose: Nose normal.  Mouth/Throat: Oropharynx is clear and moist. No oropharyngeal exudate.  Eyes: Conjunctivae and EOM are normal. Pupils are equal, round, and reactive to light. Right eye exhibits no discharge. Left eye exhibits no discharge. No scleral icterus.  Neck: Normal range of motion. Neck supple. No JVD present. No tracheal deviation present. No thyromegaly present.  Cardiovascular: Normal rate, regular rhythm, normal heart sounds and intact distal pulses. Exam reveals no gallop and no friction rub.  No murmur heard. Pulmonary/Chest: Effort normal and breath sounds normal. No respiratory distress. She has no wheezes. She has no rales. She exhibits no tenderness.  Abdominal: Soft. Bowel sounds are normal. She exhibits no distension and no mass. There is no tenderness. There is no rebound and no guarding.  Musculoskeletal: Normal range of motion. She exhibits no edema, tenderness or deformity.  Lymphadenopathy:    She has no cervical adenopathy.  Neurological: She is alert and oriented to person, place, and time. She has normal reflexes. She displays normal reflexes. No cranial nerve deficit. She exhibits normal muscle tone. Coordination normal.  Skin: Skin is warm and dry. No rash noted. No erythema. No pallor.  Psychiatric: She has a normal mood and affect. Her behavior is normal. Judgment and thought content normal.  Nursing note and vitals  reviewed.      Assessment & Plan:

## 2017-11-28 DIAGNOSIS — Z6837 Body mass index (BMI) 37.0-37.9, adult: Secondary | ICD-10-CM | POA: Diagnosis not present

## 2017-11-28 DIAGNOSIS — Z6841 Body Mass Index (BMI) 40.0 and over, adult: Secondary | ICD-10-CM | POA: Diagnosis not present

## 2017-11-28 DIAGNOSIS — R300 Dysuria: Secondary | ICD-10-CM | POA: Diagnosis not present

## 2017-11-28 DIAGNOSIS — Z1231 Encounter for screening mammogram for malignant neoplasm of breast: Secondary | ICD-10-CM | POA: Diagnosis not present

## 2017-11-28 DIAGNOSIS — Z01419 Encounter for gynecological examination (general) (routine) without abnormal findings: Secondary | ICD-10-CM | POA: Diagnosis not present

## 2017-11-29 DIAGNOSIS — H1013 Acute atopic conjunctivitis, bilateral: Secondary | ICD-10-CM | POA: Diagnosis not present

## 2017-11-29 DIAGNOSIS — H40033 Anatomical narrow angle, bilateral: Secondary | ICD-10-CM | POA: Diagnosis not present

## 2018-01-06 ENCOUNTER — Telehealth: Payer: Self-pay | Admitting: *Deleted

## 2018-01-06 NOTE — Telephone Encounter (Signed)
error 

## 2018-01-28 ENCOUNTER — Encounter: Payer: Self-pay | Admitting: Adult Health

## 2018-01-28 ENCOUNTER — Ambulatory Visit (INDEPENDENT_AMBULATORY_CARE_PROVIDER_SITE_OTHER): Payer: 59 | Admitting: Adult Health

## 2018-01-28 VITALS — BP 140/100 | Temp 98.3°F | Ht 63.0 in | Wt 229.0 lb

## 2018-01-28 DIAGNOSIS — F329 Major depressive disorder, single episode, unspecified: Secondary | ICD-10-CM | POA: Diagnosis not present

## 2018-01-28 DIAGNOSIS — R030 Elevated blood-pressure reading, without diagnosis of hypertension: Secondary | ICD-10-CM | POA: Diagnosis not present

## 2018-01-28 DIAGNOSIS — Z0000 Encounter for general adult medical examination without abnormal findings: Secondary | ICD-10-CM

## 2018-01-28 DIAGNOSIS — Z114 Encounter for screening for human immunodeficiency virus [HIV]: Secondary | ICD-10-CM

## 2018-01-28 DIAGNOSIS — Z23 Encounter for immunization: Secondary | ICD-10-CM | POA: Diagnosis not present

## 2018-01-28 LAB — HEMOGLOBIN A1C: Hgb A1c MFr Bld: 6.1 % (ref 4.6–6.5)

## 2018-01-28 LAB — LIPID PANEL
CHOLESTEROL: 178 mg/dL (ref 0–200)
HDL: 64.2 mg/dL (ref >39.00)
LDL Cholesterol: 101 mg/dL — ABNORMAL HIGH (ref 0–99)
NONHDL: 113.37
Total CHOL/HDL Ratio: 3
Triglycerides: 64 mg/dL (ref 0.0–149.0)
VLDL: 12.8 mg/dL (ref 0.0–40.0)

## 2018-01-28 LAB — BASIC METABOLIC PANEL
BUN: 10 mg/dL (ref 6–23)
CHLORIDE: 102 meq/L (ref 96–112)
CO2: 28 meq/L (ref 19–32)
Calcium: 9.4 mg/dL (ref 8.4–10.5)
Creatinine, Ser: 0.96 mg/dL (ref 0.40–1.20)
GFR: 81.53 mL/min (ref >60.00)
GLUCOSE: 96 mg/dL (ref 70–99)
POTASSIUM: 4.6 meq/L (ref 3.5–5.1)
Sodium: 138 mEq/L (ref 135–145)

## 2018-01-28 LAB — CBC WITH DIFFERENTIAL/PLATELET
BASOS PCT: 0.6 % (ref 0.0–3.0)
Basophils Absolute: 0 10*3/uL (ref 0.0–0.1)
EOS PCT: 0.9 % (ref 0.0–5.0)
Eosinophils Absolute: 0.1 10*3/uL (ref 0.0–0.7)
HCT: 38 % (ref 36.0–46.0)
Hemoglobin: 12.3 g/dL (ref 12.0–15.0)
LYMPHS ABS: 2.5 10*3/uL (ref 0.7–4.0)
Lymphocytes Relative: 36.5 % (ref 12.0–46.0)
MCHC: 32.4 g/dL (ref 30.0–36.0)
MCV: 76 fl — AB (ref 78.0–100.0)
MONO ABS: 0.4 10*3/uL (ref 0.1–1.0)
Monocytes Relative: 5.9 % (ref 3.0–12.0)
NEUTROS ABS: 3.9 10*3/uL (ref 1.4–7.7)
NEUTROS PCT: 56.1 % (ref 43.0–77.0)
Platelets: 549 10*3/uL — ABNORMAL HIGH (ref 150.0–400.0)
RBC: 4.99 Mil/uL (ref 3.87–5.11)
RDW: 15.6 % — AB (ref 11.5–15.5)
WBC: 6.9 10*3/uL (ref 4.0–10.5)

## 2018-01-28 LAB — TSH: TSH: 2.52 u[IU]/mL (ref 0.35–4.50)

## 2018-01-28 LAB — HEPATIC FUNCTION PANEL
ALBUMIN: 4.1 g/dL (ref 3.5–5.2)
ALT: 18 U/L (ref 0–35)
AST: 15 U/L (ref 0–37)
Alkaline Phosphatase: 118 U/L — ABNORMAL HIGH (ref 39–117)
Bilirubin, Direct: 0.1 mg/dL (ref 0.0–0.3)
TOTAL PROTEIN: 7.6 g/dL (ref 6.0–8.3)
Total Bilirubin: 0.4 mg/dL (ref 0.2–1.2)

## 2018-01-28 MED ORDER — CITALOPRAM HYDROBROMIDE 10 MG PO TABS: 10.0000 mg | ORAL_TABLET | Freq: Every day | ORAL | 2 refills | Status: DC

## 2018-01-28 NOTE — Patient Instructions (Signed)
It was great seeing you today   I have prescribed a medication called Celexa for anxiety and depression   Please follow up in one month or sooner if needed  I will follow up with you regarding your blood work

## 2018-01-28 NOTE — Progress Notes (Signed)
Subjective:    Patient ID: Julie Dunn, female    DOB: [birth year], 42 y.o.   MRN: [id number]  HPI  Patient presents for yearly preventative medicine examination. She is a pleasant 30 female who  has a past medical history of Allergy, Anemia, Anxiety, Depression, Headache, Heart murmur, and Shortness of breath dyspnea.  Migraines - has been prescribed Elavil 10 mg in the past. She reports that she has been taking this as needed. Has migraines about 50 % of her days. Elavil works but she does not take it on a daily basis. Feels as though her migraines are due to stress/anxeity from work   Depression/Anxiety -feels as though she is always suffered from depression but that is becoming worse as of lately.  Contributing factors include failing a certification exam and work stress.  She does report people have started to notice a change in her attitude work and she has loss of appetite.  Interested in starting medication for depression  All immunizations and health maintenance protocols were reviewed with the patient and needed orders were placed. She is due for tetanus booster.   Appropriate screening laboratory values were ordered for the patient including screening of hyperlipidemia, renal function and hepatic function.  Medication reconciliation,  past medical history, social history, problem list and allergies were reviewed in detail with the patient  Goals were established with regard to weight loss, exercise, and  diet in compliance with medications. She does try and eat healthy but does not exercise on a regular basis  Wt Readings from Last 3 Encounters:  01/28/18 229 lb (103.9 kg)  09/19/17 229 lb (103.9 kg)  08/20/17 232 lb (105.2 kg)   He is up-to-date on health maintenance items such as GYN exams and routine eye exam.  She has not scheduled her dental visit yet.  Review of Systems  Constitutional: Positive for activity change and appetite change.  HENT: Negative.   Eyes:  Negative.   Respiratory: Negative.   Cardiovascular: Negative.   Gastrointestinal: Negative.   Endocrine: Negative.   Genitourinary: Negative.   Musculoskeletal: Negative.   Skin: Negative.   Allergic/Immunologic: Negative.   Neurological: Positive for headaches.  Psychiatric/Behavioral: Positive for behavioral problems and sleep disturbance. Negative for decreased concentration, self-injury and suicidal ideas. The patient is nervous/anxious.    Past Medical History:  Diagnosis Date  . Allergy   . Anemia   . Anxiety   . Depression   . Headache    Migraines  . Heart murmur    childhood  . Shortness of breath dyspnea    former smoker    Social History   Socioeconomic History  . Marital status: Married    Spouse name: Not on file  . Number of children: Not on file  . Years of education: Not on file  . Highest education level: Not on file  Occupational History  . Not on file  Social Needs  . Financial resource strain: Not on file  . Food insecurity:    Worry: Not on file    Inability: Not on file  . Transportation needs:    Medical: Not on file    Non-medical: Not on file  Tobacco Use  . Smoking status: Former Smoker    Packs/day: 0.25    Years: 15.00    Pack years: 3.75    Types: Cigarettes    Last attempt to quit: 01/30/2007    Years since quitting: 11.0  . Smokeless tobacco: Never Used  Substance  and Sexual Activity  . Alcohol use: Yes    Alcohol/week: 0.0 oz  . Drug use: No  . Sexual activity: Yes    Birth control/protection: Surgical  Lifestyle  . Physical activity:    Days per week: Not on file    Minutes per session: Not on file  . Stress: Not on file  Relationships  . Social connections:    Talks on phone: Not on file    Gets together: Not on file    Attends religious service: Not on file    Active member of club or organization: Not on file    Attends meetings of clubs or organizations: Not on file    Relationship status: Not on file  .  Intimate partner violence:    Fear of current or ex partner: Not on file    Emotionally abused: Not on file    Physically abused: Not on file    Forced sexual activity: Not on file  Other Topics Concern  . Not on file  Social History Narrative  . Not on file    Past Surgical History:  Procedure Laterality Date  . CHOLECYSTECTOMY    . CYSTOSCOPY N/A 02/14/2016   Procedure: CYSTOSCOPY;  Surgeon: Ena Dawley, MD;  Location: [facility];  Service: Gynecology;  Laterality: N/A;  . DIAGNOSTIC LAPAROSCOPY    . LAPAROSCOPIC VAGINAL HYSTERECTOMY WITH SALPINGECTOMY Bilateral 02/14/2016   Procedure: LAPAROSCOPIC ASSISTED VAGINAL HYSTERECTOMY WITH BILATERAL SALPINGECTOMY;  Surgeon: Ena Dawley, MD;  Location: [facility];  Service: Gynecology;  Laterality: Bilateral;  . TUBAL LIGATION    . WISDOM TOOTH EXTRACTION      Family History  Problem Relation Age of Onset  . Hypertension Mother   . Hypertension Maternal Grandmother     No Known Allergies  Current Outpatient Medications on File Prior to Visit  Medication Sig Dispense Refill  . ibuprofen (ADVIL,MOTRIN) 600 MG tablet 1 po pc every 6 hours for 5 days then as needed for pain 90 tablet 1   No current facility-administered medications on file prior to visit.     BP (!) 140/100 Comment: Bilateral  Temp 98.3 F (36.8 C) (Oral)   Ht 5\' 3"  (1.6 m)   Wt 229 lb (103.9 kg)   LMP 01/05/2016 (Approximate)   BMI 40.57 kg/m       Objective:   Physical Exam  Constitutional: She is oriented to person, place, and time. She appears well-developed and well-nourished. No distress.  HENT:  Head: Normocephalic and atraumatic.  Right Ear: External ear normal.  Left Ear: External ear normal.  Nose: Nose normal.  Mouth/Throat: Oropharynx is clear and moist. Abnormal dentition. No oropharyngeal exudate.  Eyes: Pupils are equal, round, and reactive to light. Conjunctivae and EOM are normal. Right eye exhibits no discharge. Left eye exhibits no  discharge. No scleral icterus.  Neck: Normal range of motion. Neck supple. No JVD present. No tracheal deviation present. No thyromegaly present.  Cardiovascular: Normal rate, regular rhythm, normal heart sounds and intact distal pulses. Exam reveals no gallop and no friction rub.  No murmur heard. Pulmonary/Chest: Effort normal and breath sounds normal. No stridor. No respiratory distress. She has no wheezes. She has no rales. She exhibits no tenderness.  Abdominal: Soft. Bowel sounds are normal. She exhibits no distension and no mass. There is no tenderness. There is no rebound and no guarding.  Genitourinary:  Genitourinary Comments: Done by GYN  Musculoskeletal: Normal range of motion. She exhibits no edema, tenderness or deformity.  Lymphadenopathy:    She has no cervical adenopathy.  Neurological: She is alert and oriented to person, place, and time. She has normal reflexes. She displays normal reflexes. No cranial nerve deficit. She exhibits normal muscle tone. Coordination normal.  Skin: Skin is warm and dry. No rash noted. She is not diaphoretic. No erythema. No pallor.  Psychiatric: Her speech is normal and behavior is normal. Judgment and thought content normal. Cognition and memory are normal. She exhibits a depressed mood.  Tearful through much of the exam  Nursing note and vitals reviewed.     Assessment & Plan:   1. Routine general medical examination at a health care facility -Encouraged routine exercise and heart healthy diet to help her lose weight. -[place] up in 1 year for next CPE or sooner if needed - Basic metabolic panel - CBC with Differential/Platelet - Hemoglobin A1c - Hepatic function panel - Lipid panel - TSH  2. Encounter for screening for HIV  - HIV antibody  3. Reactive depression PHQ 9 score of 12 will start on Celexa.  Side effects reviewed with patient.  She was advised if she has any suicidal ideation to stop medication and present to the emergency  room immediately - citalopram (CELEXA) 10 MG tablet; Take 1 tablet (10 mg total) by mouth daily.  Dispense: 30 tablet; Refill: 2  4. Need for tetanus booster  - Td vaccine greater than or equal to 7yo preservative free IM  5. Elevated blood pressure reading -We will keep an eye on this.  Blood pressures have been elevated during the last couple of visits.  Possible stress response.  We will start her on Celexa and reevaluate in 4 weeks.  Consider adding lisinopril at next visit   Dorothyann Peng, NP

## 2018-01-29 ENCOUNTER — Other Ambulatory Visit: Payer: Self-pay | Admitting: Adult Health

## 2018-01-29 DIAGNOSIS — D473 Essential (hemorrhagic) thrombocythemia: Secondary | ICD-10-CM

## 2018-01-29 DIAGNOSIS — D75839 Thrombocytosis, unspecified: Secondary | ICD-10-CM

## 2018-01-29 LAB — HIV ANTIBODY (ROUTINE TESTING W REFLEX): HIV 1&2 Ab, 4th Generation: NONREACTIVE

## 2018-02-11 ENCOUNTER — Encounter: Payer: Self-pay | Admitting: Family Medicine

## 2018-02-17 ENCOUNTER — Other Ambulatory Visit: Payer: Self-pay | Admitting: Adult Health

## 2018-02-17 DIAGNOSIS — G43001 Migraine without aura, not intractable, with status migrainosus: Secondary | ICD-10-CM

## 2018-02-17 NOTE — Telephone Encounter (Signed)
Sent to the pharmacy by e-scribe. 

## 2018-02-27 ENCOUNTER — Encounter: Payer: Self-pay | Admitting: Adult Health

## 2018-02-27 ENCOUNTER — Ambulatory Visit: Payer: 59 | Admitting: Adult Health

## 2018-02-27 VITALS — BP 144/100 | Temp 98.1°F | Wt 230.0 lb

## 2018-02-27 DIAGNOSIS — I10 Essential (primary) hypertension: Secondary | ICD-10-CM | POA: Diagnosis not present

## 2018-02-27 DIAGNOSIS — F329 Major depressive disorder, single episode, unspecified: Secondary | ICD-10-CM

## 2018-02-27 DIAGNOSIS — F419 Anxiety disorder, unspecified: Secondary | ICD-10-CM | POA: Diagnosis not present

## 2018-02-27 DIAGNOSIS — F32A Depression, unspecified: Secondary | ICD-10-CM | POA: Insufficient documentation

## 2018-02-27 MED ORDER — PROPRANOLOL HCL ER 60 MG PO CP24: 60.0000 mg | ORAL_CAPSULE | Freq: Every day | ORAL | 1 refills | Status: DC

## 2018-02-27 NOTE — Patient Instructions (Signed)
It was great seeing you today and I am glad you are feeling better.   I have prescribed Propranolol for your blood pressure and migraines   Please sent me your blood pressure readings in a week

## 2018-02-27 NOTE — Progress Notes (Signed)
Subjective:    Patient ID: Julie Dunn, female    DOB: [birth year], 42 y.o.   MRN: [id number]  HPI 42 year old female who  has a past medical history of Allergy, Anemia, Anxiety, Depression, Headache, Heart murmur, and Shortness of breath dyspnea.  She presents to the office today for one-month follow-up regarding anxiety and depression.  During the last visit she was started on Celexa 10 mg.  Rating factors included failing of a certification exam and work stress  Today in the office she reports that she is feeling much improved. She feels as though her anxiety is much better and stress level has come down. She denies any side effects of the medication. She also reports she is sleeping better.   Unfortunately, her blood pressure has not improved.  She has began dieting and exercising.   BP Readings from Last 3 Encounters:  02/27/18 (!) 144/100  01/28/18 (!) 140/100  09/19/17 (!) 142/96    Review of Systems  Constitutional: Negative.   Respiratory: Negative.   Cardiovascular: Negative.   Gastrointestinal: Negative.   Skin: Negative.   Neurological: Positive for headaches (chronic ).  Psychiatric/Behavioral: Negative for suicidal ideas. The patient is nervous/anxious.    Past Medical History:  Diagnosis Date  . Allergy   . Anemia   . Anxiety   . Depression   . Headache    Migraines  . Heart murmur    childhood  . Shortness of breath dyspnea    former smoker    Social History   Socioeconomic History  . Marital status: Married    Spouse name: Not on file  . Number of children: Not on file  . Years of education: Not on file  . Highest education level: Not on file  Occupational History  . Not on file  Social Needs  . Financial resource strain: Not on file  . Food insecurity:    Worry: Not on file    Inability: Not on file  . Transportation needs:    Medical: Not on file    Non-medical: Not on file  Tobacco Use  . Smoking status: Former Smoker    Packs/day:  0.25    Years: 15.00    Pack years: 3.75    Types: Cigarettes    Last attempt to quit: 01/30/2007    Years since quitting: 11.0  . Smokeless tobacco: Never Used  Substance and Sexual Activity  . Alcohol use: Yes    Alcohol/week: 0.0 oz  . Drug use: No  . Sexual activity: Yes    Birth control/protection: Surgical  Lifestyle  . Physical activity:    Days per week: Not on file    Minutes per session: Not on file  . Stress: Not on file  Relationships  . Social connections:    Talks on phone: Not on file    Gets together: Not on file    Attends religious service: Not on file    Active member of club or organization: Not on file    Attends meetings of clubs or organizations: Not on file    Relationship status: Not on file  . Intimate partner violence:    Fear of current or ex partner: Not on file    Emotionally abused: Not on file    Physically abused: Not on file    Forced sexual activity: Not on file  Other Topics Concern  . Not on file  Social History Narrative  . Not on file  Past Surgical History:  Procedure Laterality Date  . CHOLECYSTECTOMY    . CYSTOSCOPY N/A 02/14/2016   Procedure: CYSTOSCOPY;  Surgeon: Ena Dawley, MD;  Location: [facility];  Service: Gynecology;  Laterality: N/A;  . DIAGNOSTIC LAPAROSCOPY    . LAPAROSCOPIC VAGINAL HYSTERECTOMY WITH SALPINGECTOMY Bilateral 02/14/2016   Procedure: LAPAROSCOPIC ASSISTED VAGINAL HYSTERECTOMY WITH BILATERAL SALPINGECTOMY;  Surgeon: Ena Dawley, MD;  Location: [facility];  Service: Gynecology;  Laterality: Bilateral;  . TUBAL LIGATION    . WISDOM TOOTH EXTRACTION      Family History  Problem Relation Age of Onset  . Hypertension Mother   . Hypertension Maternal Grandmother     No Known Allergies  Current Outpatient Medications on File Prior to Visit  Medication Sig Dispense Refill  . citalopram (CELEXA) 10 MG tablet Take 1 tablet (10 mg total) by mouth daily. 30 tablet 2  . ibuprofen (ADVIL,MOTRIN) 600 MG  tablet 1 po pc every 6 hours for 5 days then as needed for pain 90 tablet 1   No current facility-administered medications on file prior to visit.     BP (!) 144/100   Temp 98.1 F (36.7 C) (Oral)   Wt 230 lb (104.3 kg)   LMP 01/05/2016 (Approximate)   BMI 40.74 kg/m       Objective:   Physical Exam  Constitutional: She is oriented to person, place, and time. She appears well-developed and well-nourished. No distress.  Cardiovascular: Normal rate, regular rhythm, normal heart sounds and intact distal pulses. Exam reveals no gallop and no friction rub.  No murmur heard. Pulmonary/Chest: Effort normal and breath sounds normal. No stridor. No respiratory distress. She has no wheezes. She has no rales. She exhibits no tenderness.  Musculoskeletal: Normal range of motion.  Neurological: She is alert and oriented to person, place, and time.  Skin: Skin is warm and dry. Capillary refill takes less than 2 seconds. She is not diaphoretic.  Psychiatric: She has a normal mood and affect. Her behavior is normal. Judgment and thought content normal.  Nursing note and vitals reviewed.     Assessment & Plan:  1. Anxiety and depression - Better controlled. No change in dose at this time.   2. Essential hypertension - Will start on Inderal LA. Hopefully this will help with migraines as well.  - She will send me her BP results in two weeks.  - Side effects and return precautions given  - propranolol ER (INDERAL LA) 60 MG 24 hr capsule; Take 1 capsule (60 mg total) by mouth daily.  Dispense: 90 capsule; Refill: 1   Dorothyann Peng, NP

## 2018-03-06 ENCOUNTER — Other Ambulatory Visit: Payer: Self-pay | Admitting: Adult Health

## 2018-03-10 NOTE — Telephone Encounter (Signed)
Ok to refill for 90 days  

## 2018-03-10 NOTE — Telephone Encounter (Signed)
Sent to the pharmacy by e-scribe. 

## 2018-04-11 ENCOUNTER — Other Ambulatory Visit: Payer: Self-pay | Admitting: Adult Health

## 2018-04-14 NOTE — Telephone Encounter (Signed)
Sent to the pharmacy by e-scribe. 

## 2018-04-28 ENCOUNTER — Ambulatory Visit: Payer: 59 | Admitting: Adult Health

## 2018-04-28 ENCOUNTER — Encounter: Payer: Self-pay | Admitting: Adult Health

## 2018-04-28 VITALS — BP 140/90 | Temp 98.4°F | Wt 233.0 lb

## 2018-04-28 DIAGNOSIS — F410 Panic disorder [episodic paroxysmal anxiety] without agoraphobia: Secondary | ICD-10-CM | POA: Diagnosis not present

## 2018-04-28 DIAGNOSIS — R079 Chest pain, unspecified: Secondary | ICD-10-CM

## 2018-04-28 DIAGNOSIS — F419 Anxiety disorder, unspecified: Secondary | ICD-10-CM

## 2018-04-28 DIAGNOSIS — F329 Major depressive disorder, single episode, unspecified: Secondary | ICD-10-CM

## 2018-04-28 MED ORDER — CITALOPRAM HYDROBROMIDE 20 MG PO TABS: 20.0000 mg | ORAL_TABLET | Freq: Every day | ORAL | 0 refills | Status: DC

## 2018-04-28 MED ORDER — CLONAZEPAM 0.5 MG PO TABS
0.5000 mg | ORAL_TABLET | Freq: Two times a day (BID) | ORAL | 0 refills | Status: DC | PRN
Start: 2018-04-28 — End: 2018-07-10

## 2018-04-28 NOTE — Progress Notes (Signed)
Subjective:    Patient ID: Julie Dunn, female    DOB: Jul 12, 1976, 42 y.o.   MRN: [id number]  HPI 42 year old female who  has a past medical history of Allergy, Anemia, Anxiety, Depression, Headache, Heart murmur, and Shortness of breath dyspnea. She presents to the office today for increased anxiety and panic attacks. When I last saw her two months ago she reported that her anxiety was much improved on Celexa 20 mg tablets. 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 She reports worsening anxiety, depression, panic attacks and episodes of non radiating left sided chest pain that is felt as "crushing and pulling".    Review of Systems See HPI   Past Medical History:  Diagnosis Date  . Allergy   . Anemia   . Anxiety   . Depression   . Headache    Migraines  . Heart murmur    childhood  . Shortness of breath dyspnea    former smoker    Social History   Socioeconomic History  . Marital status: Married    Spouse name: Not on file  . Number of children: Not on file  . Years of education: Not on file  . Highest education level: Not on file  Occupational History  . Not on file  Social Needs  . Financial resource strain: Not on file  . Food insecurity:    Worry: Not on file    Inability: Not on file  . Transportation needs:    Medical: Not on file    Non-medical: Not on file  Tobacco Use  . Smoking status: Former Smoker    Packs/day: 0.25    Years: 15.00    Pack years: 3.75    Types: Cigarettes    Last attempt to quit: 01/30/2007    Years since quitting: 11.2  . Smokeless tobacco: Never Used  Substance and Sexual Activity  . Alcohol use: Yes    Alcohol/week: 0.0 oz  . Drug use: No  . Sexual activity: Yes    Birth control/protection: Surgical  Lifestyle  . Physical activity:    Days per week: Not on file    Minutes per session: Not on file    . Stress: Not on file  Relationships  . Social connections:    Talks on phone: Not on file    Gets together: Not on file    Attends religious service: Not on file    Active member of club or organization: Not on file    Attends meetings of clubs or organizations: Not on file    Relationship status: Not on file  . Intimate partner violence:    Fear of current or ex partner: Not on file    Emotionally abused: Not on file    Physically abused: Not on file    Forced sexual activity: Not on file  Other Topics Concern  . Not on file  Social History Narrative  . Not on file    Past Surgical History:  Procedure Laterality Date  . CHOLECYSTECTOMY    . CYSTOSCOPY N/A 02/14/2016   Procedure: CYSTOSCOPY;  Surgeon: Ena Dawley, MD;  Location: [facility];  Service: Gynecology;  Laterality: N/A;  . DIAGNOSTIC LAPAROSCOPY    . LAPAROSCOPIC VAGINAL HYSTERECTOMY WITH SALPINGECTOMY Bilateral 02/14/2016   Procedure: LAPAROSCOPIC ASSISTED VAGINAL HYSTERECTOMY WITH BILATERAL  SALPINGECTOMY;  Surgeon: Ena Dawley, MD;  Location: [facility];  Service: Gynecology;  Laterality: Bilateral;  . TUBAL LIGATION    . WISDOM TOOTH EXTRACTION      Family History  Problem Relation Age of Onset  . Hypertension Mother   . Hypertension Maternal Grandmother     No Known Allergies  Current Outpatient Medications on File Prior to Visit  Medication Sig Dispense Refill  . citalopram (CELEXA) 10 MG tablet Take 1 tablet (10 mg total) by mouth daily. 30 tablet 2  . ibuprofen (ADVIL,MOTRIN) 600 MG tablet 1 BY MOUTH AFTER MEALS EVERY 6 HOURS FOR 5 DAYS THEN AS NEEDED FOR PAIN 90 tablet 0  . propranolol ER (INDERAL LA) 60 MG 24 hr capsule Take 1 capsule (60 mg total) by mouth daily. 90 capsule 1   No current facility-administered medications on file prior to visit.     BP 140/90   Temp 98.4 F (36.9 C) (Oral)   Wt 233 lb (105.7 kg)   LMP 01/05/2016 (Approximate)   BMI 41.27 kg/m       Objective:   Physical  Exam  Constitutional: She is oriented to person, place, and time. She appears well-developed and well-nourished. No distress.  Cardiovascular: Normal rate, regular rhythm, normal heart sounds and intact distal pulses.  Pulmonary/Chest: Effort normal and breath sounds normal. No stridor. No respiratory distress. She has no wheezes. She has no rales. She exhibits no tenderness.  Neurological: She is alert and oriented to person, place, and time.  Skin: Skin is warm and dry. She is not diaphoretic.  Psychiatric: Thought content normal. She is withdrawn. Cognition and memory are normal. She exhibits a depressed mood. She expresses no suicidal ideation. She expresses no suicidal plans and no homicidal plans.  Tearful through much of the exam    Nursing note and vitals reviewed.     Assessment & Plan:  1. Chest pain, unspecified type - EKG 12-Lead- Sinus Brady, rate 55 - likely from panic attacks and anxiety   2. Anxiety - Will increase Celexa from 10 mg to 20 mg  - citalopram (CELEXA) 20 MG tablet; Take 1 tablet (20 mg total) by mouth daily.  Dispense: 90 tablet; Refill: 0 - Follow up in 30 days or sooner if needed  3. Panic attacks - - Will increase Celexa from 10 mg to 20 mg. Will add Klonopin PRN for panic attacks  - citalopram (CELEXA) 20 MG tablet; Take 1 tablet (20 mg total) by mouth daily.  Dispense: 90 tablet; Refill: 0 - clonazePAM (KLONOPIN) 0.5 MG tablet; Take 1 tablet (0.5 mg total) by mouth 2 (two) times daily as needed for anxiety.  Dispense: 20 tablet; Refill: 0  4. Reactive depression - Will increase Celexa from 10 mg to 20 mg  - citalopram (CELEXA) 20 MG tablet; Take 1 tablet (20 mg total) by mouth daily.  Dispense: 90 tablet; Refill: 0   Dorothyann Peng, NP

## 2018-05-05 ENCOUNTER — Other Ambulatory Visit: Payer: Self-pay | Admitting: Adult Health

## 2018-05-05 DIAGNOSIS — F329 Major depressive disorder, single episode, unspecified: Secondary | ICD-10-CM

## 2018-05-05 NOTE — Telephone Encounter (Signed)
Denied.  Pt is not taking 10 mg tabs.  She is taking 20 mg tabs.

## 2018-05-15 ENCOUNTER — Other Ambulatory Visit: Payer: Self-pay

## 2018-05-15 ENCOUNTER — Emergency Department (HOSPITAL_COMMUNITY)
Admission: EM | Admit: 2018-05-15 | Discharge: 2018-05-16 | Disposition: A | Discharge status: Home / Self Care | Payer: 59 | Attending: Emergency Medicine | Admitting: Emergency Medicine

## 2018-05-15 ENCOUNTER — Encounter (HOSPITAL_COMMUNITY): Payer: Self-pay | Admitting: Emergency Medicine

## 2018-05-15 DIAGNOSIS — M542 Cervicalgia: Secondary | ICD-10-CM | POA: Diagnosis not present

## 2018-05-15 DIAGNOSIS — S79911A Unspecified injury of right hip, initial encounter: Secondary | ICD-10-CM | POA: Diagnosis not present

## 2018-05-15 DIAGNOSIS — Y92411 Interstate highway as the place of occurrence of the external cause: Secondary | ICD-10-CM | POA: Insufficient documentation

## 2018-05-15 DIAGNOSIS — I10 Essential (primary) hypertension: Secondary | ICD-10-CM | POA: Insufficient documentation

## 2018-05-15 DIAGNOSIS — Y998 Other external cause status: Secondary | ICD-10-CM | POA: Insufficient documentation

## 2018-05-15 DIAGNOSIS — M545 Low back pain: Secondary | ICD-10-CM | POA: Diagnosis present

## 2018-05-15 DIAGNOSIS — M791 Myalgia, unspecified site: Secondary | ICD-10-CM | POA: Insufficient documentation

## 2018-05-15 DIAGNOSIS — Y939 Activity, unspecified: Secondary | ICD-10-CM | POA: Insufficient documentation

## 2018-05-15 DIAGNOSIS — Z79899 Other long term (current) drug therapy: Secondary | ICD-10-CM | POA: Insufficient documentation

## 2018-05-15 DIAGNOSIS — Z87891 Personal history of nicotine dependence: Secondary | ICD-10-CM | POA: Diagnosis not present

## 2018-05-15 DIAGNOSIS — M546 Pain in thoracic spine: Secondary | ICD-10-CM | POA: Diagnosis not present

## 2018-05-15 DIAGNOSIS — M7918 Myalgia, other site: Secondary | ICD-10-CM

## 2018-05-15 HISTORY — DX: Endometriosis, unspecified: N80.9

## 2018-05-15 MED ORDER — KETOROLAC TROMETHAMINE 15 MG/ML IJ SOLN
15.0000 mg | Freq: Once | INTRAMUSCULAR | Status: AC
  Administered 2018-05-16: 15 mg via INTRAVENOUS
  Filled 2018-05-15: qty 1
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
## 2018-05-16 ENCOUNTER — Emergency Department (HOSPITAL_COMMUNITY): Payer: 59

## 2018-05-16 DIAGNOSIS — S79911A Unspecified injury of right hip, initial encounter: Secondary | ICD-10-CM | POA: Diagnosis not present

## 2018-05-16 DIAGNOSIS — M545 Low back pain: Secondary | ICD-10-CM | POA: Diagnosis not present

## 2018-05-16 MED ORDER — METHOCARBAMOL 500 MG PO TABS: 500.0000 mg | ORAL_TABLET | Freq: Two times a day (BID) | ORAL | 0 refills | Status: DC

## 2018-05-16 NOTE — ED Notes (Signed)
Patient transported to X-ray 
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
## 2018-05-21 ENCOUNTER — Telehealth: Payer: Self-pay | Admitting: Family Medicine
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
## 2018-05-22 NOTE — Telephone Encounter (Signed)
Left a message for a return call.

## 2018-05-28 NOTE — Telephone Encounter (Signed)
Left a message for a return call.

## 2018-06-02 ENCOUNTER — Ambulatory Visit: Payer: 59 | Admitting: Adult Health

## 2018-06-02 ENCOUNTER — Encounter: Payer: Self-pay | Admitting: Adult Health

## 2018-06-02 VITALS — BP 142/84 | HR 61 | Temp 98.3°F | Wt 236.5 lb

## 2018-06-02 DIAGNOSIS — F32A Depression, unspecified: Secondary | ICD-10-CM

## 2018-06-02 DIAGNOSIS — M546 Pain in thoracic spine: Secondary | ICD-10-CM | POA: Diagnosis not present

## 2018-06-02 DIAGNOSIS — F329 Major depressive disorder, single episode, unspecified: Secondary | ICD-10-CM

## 2018-06-02 DIAGNOSIS — F419 Anxiety disorder, unspecified: Secondary | ICD-10-CM | POA: Diagnosis not present

## 2018-06-02 MED ORDER — CYCLOBENZAPRINE HCL 10 MG PO TABS: 10.0000 mg | ORAL_TABLET | Freq: Every day | ORAL | 0 refills | Status: DC

## 2018-06-02 MED ORDER — METHYLPREDNISOLONE 4 MG PO TBPK: ORAL_TABLET | ORAL | 0 refills | Status: DC
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
## 2018-06-19 ENCOUNTER — Other Ambulatory Visit: Payer: Self-pay | Admitting: Adult Health

## 2018-06-19 DIAGNOSIS — M546 Pain in thoracic spine: Secondary | ICD-10-CM

## 2018-06-25 ENCOUNTER — Other Ambulatory Visit: Payer: Self-pay | Admitting: Adult Health

## 2018-06-25 DIAGNOSIS — F410 Panic disorder [episodic paroxysmal anxiety] without agoraphobia: Secondary | ICD-10-CM

## 2018-06-26 NOTE — Telephone Encounter (Signed)
Left a message for a return call.

## 2018-06-26 NOTE — Telephone Encounter (Signed)
Does she feel like she still needs this?

## 2018-07-01 NOTE — Telephone Encounter (Signed)
Left a message for a return call.

## 2018-07-10 ENCOUNTER — Other Ambulatory Visit: Payer: Self-pay | Admitting: Adult Health

## 2018-07-10 DIAGNOSIS — F410 Panic disorder [episodic paroxysmal anxiety] without agoraphobia: Secondary | ICD-10-CM

## 2018-07-10 NOTE — Telephone Encounter (Signed)
Last refill 04/28/18 Last office visit 06/02/18 Okay to fill?

## 2018-07-13 ENCOUNTER — Telehealth: Payer: Self-pay | Admitting: *Deleted

## 2018-07-19 ENCOUNTER — Other Ambulatory Visit: Payer: Self-pay | Admitting: Adult Health

## 2018-07-19 DIAGNOSIS — F410 Panic disorder [episodic paroxysmal anxiety] without agoraphobia: Secondary | ICD-10-CM

## 2018-07-19 DIAGNOSIS — F329 Major depressive disorder, single episode, unspecified: Secondary | ICD-10-CM

## 2018-07-19 DIAGNOSIS — F419 Anxiety disorder, unspecified: Secondary | ICD-10-CM

## 2018-07-20 NOTE — Telephone Encounter (Signed)
Sent to the pharmacy by e-scribe. 

## 2018-07-26 ENCOUNTER — Other Ambulatory Visit: Payer: Self-pay | Admitting: Adult Health

## 2018-07-28 NOTE — Telephone Encounter (Signed)
Sent to the pharmacy by e-scribe. 

## 2018-07-28 NOTE — Telephone Encounter (Signed)
Ok to refill for 90 days  

## 2018-08-15 ENCOUNTER — Other Ambulatory Visit: Payer: Self-pay | Admitting: Adult Health

## 2018-09-15 ENCOUNTER — Other Ambulatory Visit: Payer: Self-pay | Admitting: Adult Health

## 2018-09-15 DIAGNOSIS — I10 Essential (primary) hypertension: Secondary | ICD-10-CM

## 2018-09-15 NOTE — Telephone Encounter (Signed)
Sent to the pharmacy by e-scribe. 

## 2018-11-20 ENCOUNTER — Other Ambulatory Visit: Payer: Self-pay | Admitting: Adult Health

## 2018-11-20 DIAGNOSIS — F410 Panic disorder [episodic paroxysmal anxiety] without agoraphobia: Secondary | ICD-10-CM

## 2018-11-20 NOTE — Telephone Encounter (Signed)
Does she still need this. When I saw her in August her symptoms had improved greatly

## 2018-11-24 NOTE — Telephone Encounter (Signed)
Left a message for a return call.

## 2018-11-24 NOTE — Telephone Encounter (Signed)
Spoke to the pt and she would like to request a refill.

## 2019-02-05 ENCOUNTER — Encounter: Payer: 59 | Admitting: Adult Health
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
## 2019-03-18 ENCOUNTER — Other Ambulatory Visit: Payer: Self-pay | Admitting: Adult Health

## 2019-04-04 ENCOUNTER — Other Ambulatory Visit: Payer: Self-pay | Admitting: Adult Health

## 2019-04-22 ENCOUNTER — Other Ambulatory Visit: Payer: Self-pay | Admitting: Adult Health

## 2019-05-06 ENCOUNTER — Telehealth: Payer: Self-pay | Admitting: Adult Health

## 2019-05-06 NOTE — Telephone Encounter (Signed)
LMVM for the patient to contact to the office to reschedule her physical with Dorothyann Peng. He is currently not seeing patients in the office on Fridays.

## 2019-05-07 ENCOUNTER — Encounter: Payer: 59 | Admitting: Adult Health

## 2019-07-15 ENCOUNTER — Other Ambulatory Visit: Payer: Self-pay | Admitting: Adult Health

## 2019-07-15 DIAGNOSIS — F410 Panic disorder [episodic paroxysmal anxiety] without agoraphobia: Secondary | ICD-10-CM

## 2019-07-15 NOTE — Telephone Encounter (Signed)
Has not been seen in over a year

## 2019-08-05 ENCOUNTER — Encounter: Payer: 59 | Admitting: Adult Health

## 2019-08-17 ENCOUNTER — Encounter: Payer: Self-pay | Admitting: Adult Health

## 2019-08-17 ENCOUNTER — Ambulatory Visit (INDEPENDENT_AMBULATORY_CARE_PROVIDER_SITE_OTHER): Payer: 59 | Admitting: Adult Health

## 2019-08-17 ENCOUNTER — Other Ambulatory Visit: Payer: Self-pay

## 2019-08-17 VITALS — BP 140/98 | HR 65 | Temp 97.1°F | Ht 63.5 in | Wt 238.8 lb

## 2019-08-17 DIAGNOSIS — M546 Pain in thoracic spine: Secondary | ICD-10-CM | POA: Diagnosis not present

## 2019-08-17 DIAGNOSIS — F419 Anxiety disorder, unspecified: Secondary | ICD-10-CM | POA: Diagnosis not present

## 2019-08-17 DIAGNOSIS — Z23 Encounter for immunization: Secondary | ICD-10-CM

## 2019-08-17 DIAGNOSIS — Z0000 Encounter for general adult medical examination without abnormal findings: Secondary | ICD-10-CM | POA: Diagnosis not present

## 2019-08-17 DIAGNOSIS — F329 Major depressive disorder, single episode, unspecified: Secondary | ICD-10-CM

## 2019-08-17 DIAGNOSIS — I10 Essential (primary) hypertension: Secondary | ICD-10-CM | POA: Diagnosis not present

## 2019-08-17 DIAGNOSIS — F32A Depression, unspecified: Secondary | ICD-10-CM

## 2019-08-17 LAB — COMPREHENSIVE METABOLIC PANEL
ALT: 11 U/L (ref 0–35)
AST: 13 U/L (ref 0–37)
Albumin: 4.1 g/dL (ref 3.5–5.2)
Alkaline Phosphatase: 122 U/L — ABNORMAL HIGH (ref 39–117)
BUN: 9 mg/dL (ref 6–23)
CO2: 26 mEq/L (ref 19–32)
Calcium: 8.9 mg/dL (ref 8.4–10.5)
Chloride: 103 mEq/L (ref 96–112)
Creatinine, Ser: 0.88 mg/dL (ref 0.40–1.20)
GFR: 84.2 mL/min (ref >60.00)
Glucose, Bld: 101 mg/dL — ABNORMAL HIGH (ref 70–99)
Potassium: 3.8 mEq/L (ref 3.5–5.1)
Sodium: 137 mEq/L (ref 135–145)
Total Bilirubin: 0.5 mg/dL (ref 0.2–1.2)
Total Protein: 7.6 g/dL (ref 6.0–8.3)

## 2019-08-17 LAB — CBC WITH DIFFERENTIAL/PLATELET
Basophils Absolute: 0 10*3/uL (ref 0.0–0.1)
Basophils Relative: 0.6 % (ref 0.0–3.0)
Eosinophils Absolute: 0.1 10*3/uL (ref 0.0–0.7)
Eosinophils Relative: 1.4 % (ref 0.0–5.0)
HCT: 35.4 % — ABNORMAL LOW (ref 36.0–46.0)
Hemoglobin: 11.4 g/dL — ABNORMAL LOW (ref 12.0–15.0)
Lymphocytes Relative: 40 % (ref 12.0–46.0)
Lymphs Abs: 2.7 10*3/uL (ref 0.7–4.0)
MCHC: 32.1 g/dL (ref 30.0–36.0)
MCV: 74.5 fl — ABNORMAL LOW (ref 78.0–100.0)
Monocytes Absolute: 0.4 10*3/uL (ref 0.1–1.0)
Monocytes Relative: 5.8 % (ref 3.0–12.0)
Neutro Abs: 3.6 10*3/uL (ref 1.4–7.7)
Neutrophils Relative %: 52.2 % (ref 43.0–77.0)
Platelets: 541 10*3/uL — ABNORMAL HIGH (ref 150.0–400.0)
RBC: 4.76 Mil/uL (ref 3.87–5.11)
RDW: 16.2 % — ABNORMAL HIGH (ref 11.5–15.5)
WBC: 6.8 10*3/uL (ref 4.0–10.5)

## 2019-08-17 LAB — LIPID PANEL
Cholesterol: 180 mg/dL (ref 0–200)
HDL: 51.5 mg/dL (ref >39.00)
LDL Cholesterol: 115 mg/dL — ABNORMAL HIGH (ref 0–99)
NonHDL: 128.53
Total CHOL/HDL Ratio: 3
Triglycerides: 66 mg/dL (ref 0.0–149.0)
VLDL: 13.2 mg/dL (ref 0.0–40.0)

## 2019-08-17 LAB — TSH: TSH: 2.11 u[IU]/mL (ref 0.35–4.50)

## 2019-08-17 MED ORDER — CLONAZEPAM 0.5 MG PO TABS: 0.5000 mg | ORAL_TABLET | Freq: Two times a day (BID) | ORAL | 1 refills | Status: DC | PRN

## 2019-08-17 MED ORDER — CYCLOBENZAPRINE HCL 10 MG PO TABS: 10.0000 mg | ORAL_TABLET | Freq: Three times a day (TID) | ORAL | 0 refills | Status: DC | PRN

## 2019-08-17 MED ORDER — SERTRALINE HCL 50 MG PO TABS: 50.0000 mg | ORAL_TABLET | Freq: Every day | ORAL | 0 refills | Status: DC

## 2019-08-17 MED ORDER — LISINOPRIL 10 MG PO TABS: 10.0000 mg | ORAL_TABLET | Freq: Every day | ORAL | 3 refills | Status: DC

## 2019-08-17 NOTE — Progress Notes (Signed)
Subjective:    Patient ID: Julie Dunn, female    DOB: 1976-09-01, 43 y.o.   MRN: [id number]  HPI Patient presents for yearly preventative medicine examination. She is a pleasant 43 year old female who  has a past medical history of Allergy, Anemia, Anxiety, Depression, Endometriosis, Headache, Heart murmur, and Shortness of breath dyspnea.   Anxiety/Depression-she is currently prescribed Celexa 20 mg was doing well on this.  Most of her anxiety and depression comes from failing her social work board exams multiple times.  Unfortunately since the last time I saw her she failed these again.  This is resulted in worsening anxiety and depression.  She is going to try studying one additional time to retake the board exams and if she does not pass this time she is can have to start looking for another career.  Migraine Headaches -is no longer getting "migraine headaches".  She stopped taking propanolol multiple months ago as she thought it was making her urinate more frequently.  Elevated Blood Pressure -she has been monitoring her blood pressure at home and over the last few months she has been getting readings in the 140s to 150s over 80s to low 100s.  When her blood pressure is elevated she is experiencing a dull headache.  She denies dizziness, lightheadedness, chest pain or shortness of breath.  All immunizations and health maintenance protocols were reviewed with the patient and needed orders were placed.  Appropriate screening laboratory values were ordered for the patient including screening of hyperlipidemia, renal function and hepatic function. If indicated by BPH, a PSA was ordered.  Medication reconciliation,  past medical history, social history, problem list and allergies were reviewed in detail with the patient  Goals were established with regard to weight loss, exercise, and  diet in compliance with medications. Wt Readings from Last 3 Encounters:  08/17/19 238 lb 12.8 oz (108.3  kg)  06/02/18 236 lb 8 oz (107.3 kg)  04/28/18 233 lb (105.7 kg)    Review of Systems  Constitutional: Negative.   HENT: Negative.   Respiratory: Negative.   Cardiovascular: Negative.   Gastrointestinal: Negative.   Endocrine: Negative.   Genitourinary: Negative.   Musculoskeletal: Negative.   Skin: Negative.   Allergic/Immunologic: Negative.   Neurological: Positive for headaches.  Psychiatric/Behavioral: Positive for decreased concentration. The patient is nervous/anxious.   All other systems reviewed and are negative.  Past Medical History:  Diagnosis Date  . Allergy   . Anemia   . Anxiety   . Depression   . Endometriosis   . Headache    Migraines  . Heart murmur    childhood  . Shortness of breath dyspnea    former smoker    Social History   Socioeconomic History  . Marital status: Married    Spouse name: Not on file  . Number of children: Not on file  . Years of education: Not on file  . Highest education level: Not on file  Occupational History  . Not on file  Social Needs  . Financial resource strain: Not on file  . Food insecurity    Worry: Not on file    Inability: Not on file  . Transportation needs    Medical: Not on file    Non-medical: Not on file  Tobacco Use  . Smoking status: Former Smoker    Packs/day: 0.25    Years: 15.00    Pack years: 3.75    Types: Cigarettes    Quit date:  01/30/2007    Years since quitting: 12.5  . Smokeless tobacco: Never Used  Substance and Sexual Activity  . Alcohol use: Yes    Alcohol/week: 0.0 standard drinks  . Drug use: No  . Sexual activity: Yes    Birth control/protection: Surgical  Lifestyle  . Physical activity    Days per week: Not on file    Minutes per session: Not on file  . Stress: Not on file  Relationships  . Social Herbalist on phone: Not on file    Gets together: Not on file    Attends religious service: Not on file    Active member of club or organization: Not on file     Attends meetings of clubs or organizations: Not on file    Relationship status: Not on file  . Intimate partner violence    Fear of current or ex partner: Not on file    Emotionally abused: Not on file    Physically abused: Not on file    Forced sexual activity: Not on file  Other Topics Concern  . Not on file  Social History Narrative  . Not on file    Past Surgical History:  Procedure Laterality Date  . CHOLECYSTECTOMY    . CYSTOSCOPY N/A 02/14/2016   Procedure: CYSTOSCOPY;  Surgeon: Ena Dawley, MD;  Location: [facility];  Service: Gynecology;  Laterality: N/A;  . DIAGNOSTIC LAPAROSCOPY    . LAPAROSCOPIC VAGINAL HYSTERECTOMY WITH SALPINGECTOMY Bilateral 02/14/2016   Procedure: LAPAROSCOPIC ASSISTED VAGINAL HYSTERECTOMY WITH BILATERAL SALPINGECTOMY;  Surgeon: Ena Dawley, MD;  Location: [facility];  Service: Gynecology;  Laterality: Bilateral;  . TUBAL LIGATION    . WISDOM TOOTH EXTRACTION      Family History  Problem Relation Age of Onset  . Hypertension Mother   . Hypertension Maternal Grandmother     No Known Allergies  Current Outpatient Medications on File Prior to Visit  Medication Sig Dispense Refill  . ibuprofen (ADVIL) 600 MG tablet 1 BY MOUTH AFTER MEALS EVERY 6 HOURS FOR 5 DAYS THEN AS NEEDED FOR PAIN 90 tablet 0   No current facility-administered medications on file prior to visit.     BP (!) 140/98 (BP Location: Left Arm, Patient Position: Sitting, Cuff Size: Large)   Pulse 65   Temp (!) 97.1 F (36.2 C) (Temporal)   Ht 5' 3.5" (1.613 m)   Wt 238 lb 12.8 oz (108.3 kg)   LMP 01/05/2016 (Approximate)   SpO2 100%   BMI 41.64 kg/m       Objective:   Physical Exam Vitals signs and nursing note reviewed.  Constitutional:      General: She is not in acute distress.    Appearance: Normal appearance. She is obese. She is not diaphoretic.  HENT:     Head: Normocephalic and atraumatic.     Right Ear: Tympanic membrane, ear canal and external ear normal.  There is no impacted cerumen.     Left Ear: Tympanic membrane, ear canal and external ear normal. There is no impacted cerumen.     Nose: Nose normal. No congestion or rhinorrhea.     Mouth/Throat:     Mouth: Mucous membranes are moist.     Pharynx: Oropharynx is clear. No oropharyngeal exudate or posterior oropharyngeal erythema.  Eyes:     General: No scleral icterus.       Right eye: No discharge.        Left eye: No discharge.  Extraocular Movements: Extraocular movements intact.     Conjunctiva/sclera: Conjunctivae normal.     Pupils: Pupils are equal, round, and reactive to light.  Neck:     Musculoskeletal: Normal range of motion and neck supple.     Thyroid: No thyromegaly.     Vascular: No JVD.     Trachea: No tracheal deviation.  Cardiovascular:     Rate and Rhythm: Normal rate and regular rhythm.     Heart sounds: Normal heart sounds. No murmur. No friction rub. No gallop.   Pulmonary:     Effort: Pulmonary effort is normal. No respiratory distress.     Breath sounds: Normal breath sounds. No stridor. No wheezing, rhonchi or rales.  Chest:     Chest wall: No tenderness.  Abdominal:     General: Bowel sounds are normal. There is no distension.     Palpations: Abdomen is soft. There is no mass.     Tenderness: There is no abdominal tenderness. There is no right CVA tenderness, left CVA tenderness, guarding or rebound.     Hernia: No hernia is present.  Musculoskeletal: Normal range of motion.        General: No swelling, tenderness, deformity or signs of injury.     Right lower leg: No edema.     Left lower leg: No edema.  Lymphadenopathy:     Cervical: No cervical adenopathy.  Skin:    General: Skin is warm and dry.     Coloration: Skin is not jaundiced or pale.     Findings: No bruising, erythema, lesion or rash.  Neurological:     General: No focal deficit present.     Mental Status: She is alert and oriented to person, place, and time. Mental status is at  baseline.     Cranial Nerves: No cranial nerve deficit.     Sensory: No sensory deficit.     Motor: No weakness or abnormal muscle tone.     Coordination: Coordination normal.     Gait: Gait normal.     Deep Tendon Reflexes: Reflexes normal.  Psychiatric:        Mood and Affect: Mood normal. Affect is tearful.        Behavior: Behavior normal.        Thought Content: Thought content normal.        Judgment: Judgment normal.       Assessment & Plan:  1. Routine general medical examination at a health care facility -Encouraged weight loss through diet and exercise -Follow-up in 1 year - CBC with Differential/Platelet - Comprehensive metabolic panel - Hemoglobin A1c - Lipid panel - TSH  2. Anxiety and depression -We will change Celexa to Zoloft.  Would like her to get outside and start exercising more frequently.  She is also given a list of local counselors as I think talk therapy would be good for her.  We will have her follow-up in 2 to 3 weeks to see how she is doing.  Of course if she has any suicidal ideation she needs to stop her medication and go to the emergency room - sertraline (ZOLOFT) 50 MG tablet; Take 1 tablet (50 mg total) by mouth daily.  Dispense: 90 tablet; Refill: 0 - clonazePAM (KLONOPIN) 0.5 MG tablet; Take 1 tablet (0.5 mg total) by mouth 2 (two) times daily as needed for anxiety.  Dispense: 30 tablet; Refill: 1  3. Acute bilateral thoracic back pain  - cyclobenzaprine (FLEXERIL) 10 MG tablet; Take 1  tablet (10 mg total) by mouth 3 (three) times daily as needed for muscle spasms.  Dispense: 15 tablet; Refill: 0  4. Essential hypertension -We will add lisinopril 10 mg.  Advised her to monitor her blood pressures at home and follow-up in the office in 2 weeks - lisinopril (ZESTRIL) 10 MG tablet; Take 1 tablet (10 mg total) by mouth daily.  Dispense: 90 tablet; Refill: 3 - CBC with Differential/Platelet - Comprehensive metabolic panel - Hemoglobin A1c - Lipid  panel - TSH  5. Need for influenza vaccination  - Flu Vaccine QUAD 6+ mos PF IM (Fluarix Quad PF)  Dorothyann Peng, NP

## 2019-08-17 NOTE — Patient Instructions (Signed)
It was great seeing you today   I am going to change your anxiety/depression medication from Celexa to Zoloft.   I am also going to put you on a low dose of lisinopril to help keep your blood pressure down   We will follow up with you regarding your blood work   Please follow up with me in 2-3 weeks

## 2019-08-18 LAB — HEMOGLOBIN A1C: Hgb A1c MFr Bld: 6.1 % (ref 4.6–6.5)

## 2019-08-31 ENCOUNTER — Telehealth (INDEPENDENT_AMBULATORY_CARE_PROVIDER_SITE_OTHER): Payer: 59 | Admitting: Adult Health

## 2019-08-31 ENCOUNTER — Other Ambulatory Visit: Payer: Self-pay

## 2019-08-31 DIAGNOSIS — F329 Major depressive disorder, single episode, unspecified: Secondary | ICD-10-CM | POA: Diagnosis not present

## 2019-08-31 DIAGNOSIS — F419 Anxiety disorder, unspecified: Secondary | ICD-10-CM | POA: Diagnosis not present

## 2019-08-31 DIAGNOSIS — I10 Essential (primary) hypertension: Secondary | ICD-10-CM | POA: Diagnosis not present

## 2019-08-31 DIAGNOSIS — F32A Depression, unspecified: Secondary | ICD-10-CM

## 2019-08-31 MED ORDER — IBUPROFEN 400 MG PO TABS: ORAL_TABLET | ORAL | 1 refills | Status: DC

## 2019-08-31 NOTE — Progress Notes (Signed)
Virtual Visit via Video Note  I connected with Roselee Culver on 08/31/19 at  3:30 PM EST by a video enabled telemedicine application and verified that I am speaking with the correct person using two identifiers.  Location patient: home Location provider:work or home office Persons participating in the virtual visit: patient, provider  I discussed the limitations of evaluation and management by telemedicine and the availability of in person appointments. The patient expressed understanding and agreed to proceed.   HPI: 43 year old female who is being evaluated today for follow-up regarding anxiety/depression and hypertension.  During her last visit 2 weeks ago she was switched from Celexa to Zoloft and she did not feel like Celexa was benefiting her any longer.  Most of her anxiety and depression stemmed from failing her social work board exams multiple times.  She would like to take this test one additional time.  Since changing from Celexa to Zoloft she feels as though her symptoms have improved and she is feeling much better.  Her only complaint is that of intermittent nausea  She was also started on lisinopril for hypertension.  She had been monitoring her blood pressures at home and over a few months.  She was getting readings in the 140s to 150s over 80s to low 100s.  When her blood pressure was elevated she experienced a dull headache.  Since she has started lisinopril 10 mg her blood pressures have been averaging in the 138 over 90s.  Her headaches are improving and are less dull than they were prior to starting blood pressure medication.  She denies dizziness, lightheadedness, dry cough, chest pain, or shortness of breath.   ROS: See pertinent positives and negatives per HPI.  Past Medical History:  Diagnosis Date  . Allergy   . Anemia   . Anxiety   . Depression   . Endometriosis   . Headache    Migraines  . Heart murmur    childhood  . Shortness of breath dyspnea    former  smoker    Past Surgical History:  Procedure Laterality Date  . CHOLECYSTECTOMY    . CYSTOSCOPY N/A 02/14/2016   Procedure: CYSTOSCOPY;  Surgeon: Ena Dawley, MD;  Location: [facility];  Service: Gynecology;  Laterality: N/A;  . DIAGNOSTIC LAPAROSCOPY    . LAPAROSCOPIC VAGINAL HYSTERECTOMY WITH SALPINGECTOMY Bilateral 02/14/2016   Procedure: LAPAROSCOPIC ASSISTED VAGINAL HYSTERECTOMY WITH BILATERAL SALPINGECTOMY;  Surgeon: Ena Dawley, MD;  Location: [facility];  Service: Gynecology;  Laterality: Bilateral;  . TUBAL LIGATION    . WISDOM TOOTH EXTRACTION      Family History  Problem Relation Age of Onset  . Hypertension Mother   . Hypertension Maternal Grandmother       Current Outpatient Medications:  .  clonazePAM (KLONOPIN) 0.5 MG tablet, Take 1 tablet (0.5 mg total) by mouth 2 (two) times daily as needed for anxiety., Disp: 30 tablet, Rfl: 1 .  cyclobenzaprine (FLEXERIL) 10 MG tablet, Take 1 tablet (10 mg total) by mouth 3 (three) times daily as needed for muscle spasms., Disp: 15 tablet, Rfl: 0 .  ibuprofen (ADVIL) 600 MG tablet, 1 BY MOUTH AFTER MEALS EVERY 6 HOURS FOR 5 DAYS THEN AS NEEDED FOR PAIN, Disp: 90 tablet, Rfl: 0 .  lisinopril (ZESTRIL) 10 MG tablet, Take 1 tablet (10 mg total) by mouth daily., Disp: 90 tablet, Rfl: 3 .  sertraline (ZOLOFT) 50 MG tablet, Take 1 tablet (50 mg total) by mouth daily., Disp: 90 tablet, Rfl: 0  EXAM:  VITALS  per patient if applicable:  GENERAL: alert, oriented, appears well and in no acute distress  HEENT: atraumatic, conjunttiva clear, no obvious abnormalities on inspection of external nose and ears  NECK: normal movements of the head and neck  LUNGS: on inspection no signs of respiratory distress, breathing rate appears normal, no obvious gross SOB, gasping or wheezing  CV: no obvious cyanosis  MS: moves all visible extremities without noticeable abnormality  PSYCH/NEURO: pleasant and cooperative, no obvious depression or  anxiety, speech and thought processing grossly intact  ASSESSMENT AND PLAN:  Discussed the following assessment and plan:  1. Anxiety and depression - Will keep her on Zoloft 50 mg.   2. Essential hypertension - Increase lisinopril from 10 mg to 20 mg.  - Follow up via mychart with BP readings in two weeks.   I discussed the assessment and treatment plan with the patient. The patient was provided an opportunity to ask questions and all were answered. The patient agreed with the plan and demonstrated an understanding of the instructions.   The patient was advised to call back or seek an in-person evaluation if the symptoms worsen or if the condition fails to improve as anticipated.   Dorothyann Peng, NP

## 2019-09-15 ENCOUNTER — Telehealth: Payer: Self-pay | Admitting: Adult Health

## 2019-09-15 NOTE — Telephone Encounter (Signed)
Disability Accommodations form to be filled out-- placed in dr's folder.  Call [phone] upon completion.

## 2019-09-16 NOTE — Telephone Encounter (Signed)
Form received and placed in Cory's folder for review.

## 2019-09-30 NOTE — Telephone Encounter (Signed)
Cory completed form and contacted the pt for her to pick up at the front desk.  Nothing further needed.

## 2019-11-09 ENCOUNTER — Other Ambulatory Visit: Payer: Self-pay | Admitting: Adult Health

## 2019-11-09 DIAGNOSIS — F32A Depression, unspecified: Secondary | ICD-10-CM

## 2019-11-09 DIAGNOSIS — F329 Major depressive disorder, single episode, unspecified: Secondary | ICD-10-CM

## 2020-02-11 ENCOUNTER — Other Ambulatory Visit: Payer: Self-pay | Admitting: Adult Health

## 2020-04-09 ENCOUNTER — Other Ambulatory Visit: Payer: Self-pay | Admitting: Adult Health

## 2020-08-01 ENCOUNTER — Other Ambulatory Visit: Payer: Self-pay

## 2020-08-01 ENCOUNTER — Encounter: Payer: Self-pay | Admitting: Adult Health

## 2020-08-01 ENCOUNTER — Ambulatory Visit: Payer: 59 | Admitting: Adult Health

## 2020-08-01 VITALS — BP 150/100 | HR 80 | Temp 98.7°F | Ht 63.5 in | Wt 241.2 lb

## 2020-08-01 DIAGNOSIS — K2100 Gastro-esophageal reflux disease with esophagitis, without bleeding: Secondary | ICD-10-CM

## 2020-08-01 DIAGNOSIS — I10 Essential (primary) hypertension: Secondary | ICD-10-CM | POA: Diagnosis not present

## 2020-08-01 DIAGNOSIS — R079 Chest pain, unspecified: Secondary | ICD-10-CM | POA: Diagnosis not present

## 2020-08-01 DIAGNOSIS — R519 Headache, unspecified: Secondary | ICD-10-CM | POA: Diagnosis not present

## 2020-08-01 MED ORDER — OMEPRAZOLE 20 MG PO CPDR: 20.0000 mg | DELAYED_RELEASE_CAPSULE | Freq: Every day | ORAL | 0 refills | Status: DC

## 2020-08-01 MED ORDER — LISINOPRIL 20 MG PO TABS: 20.0000 mg | ORAL_TABLET | Freq: Every day | ORAL | 1 refills | Status: DC

## 2020-08-01 NOTE — Progress Notes (Signed)
Subjective:    Patient ID: Julie Dunn, female    DOB: 23-Jan-1976, 44 y.o.   MRN: [id number]  HPI 44 year old female who  has a past medical history of Allergy, Anemia, Anxiety, Depression, Endometriosis, Headache, Heart murmur, and Shortness of breath dyspnea.  She presents to the office today for constant left sided chest pain x 1 week. Pain is felt as a " cramp and something is pulling away". Pain will wake her up some sleep.   No left sided jaw pain or arm pain. No pain with ROM of upper extremities.   She denies nausea, vomiting, constipation, diarrhea but does have abdominal pain after eating, she does report intermittent episodes of epigastric pain but nothing over the last two weeks   She has not been taking her blood pressure medications.   She also reports a headache for the last three days that is present constantly across the front of her head.  She also endorses intermittent episodes of blurred vision over the last week.   Review of Systems See HPI   Past Medical History:  Diagnosis Date   Allergy    Anemia    Anxiety    Depression    Endometriosis    Headache    Migraines   Heart murmur    childhood   Shortness of breath dyspnea    former smoker    Social History   Socioeconomic History   Marital status: Married    Spouse name: Not on file   Number of children: Not on file   Years of education: Not on file   Highest education level: Not on file  Occupational History   Not on file  Tobacco Use   Smoking status: Former Smoker    Packs/day: 0.25    Years: 15.00    Pack years: 3.75    Types: Cigarettes    Quit date: 01/30/2007    Years since quitting: 13.5   Smokeless tobacco: Never Used  Vaping Use   Vaping Use: Never used  Substance and Sexual Activity   Alcohol use: Yes    Alcohol/week: 0.0 standard drinks   Drug use: No   Sexual activity: Yes    Birth control/protection: Surgical  Other Topics Concern   Not on file    Social History Narrative   Not on file   Social Determinants of Health   Financial Resource Strain:    Difficulty of Paying Living Expenses: Not on file  Food Insecurity:    Worried About Charity fundraiser in the Last Year: Not on file   [employer] of Food in the Last Year: Not on file  Transportation Needs:    Lack of Transportation (Medical): Not on file   Lack of Transportation (Non-Medical): Not on file  Physical Activity:    Days of Exercise per Week: Not on file   Minutes of Exercise per Session: Not on file  Stress:    Feeling of Stress : Not on file  Social Connections:    Frequency of Communication with Friends and Family: Not on file   Frequency of Social Gatherings with Friends and Family: Not on file   Attends Religious Services: Not on file   Active Member of Clubs or Organizations: Not on file   Attends Archivist Meetings: Not on file   Marital Status: Not on file  Intimate Partner Violence:    Fear of Current or Ex-Partner: Not on file   Emotionally Abused: Not  on file   Physically Abused: Not on file   Sexually Abused: Not on file    Past Surgical History:  Procedure Laterality Date   CHOLECYSTECTOMY     CYSTOSCOPY N/A 02/14/2016   Procedure: CYSTOSCOPY;  Surgeon: Ena Dawley, MD;  Location: [facility];  Service: Gynecology;  Laterality: N/A;   DIAGNOSTIC LAPAROSCOPY     LAPAROSCOPIC VAGINAL HYSTERECTOMY WITH SALPINGECTOMY Bilateral 02/14/2016   Procedure: LAPAROSCOPIC ASSISTED VAGINAL HYSTERECTOMY WITH BILATERAL SALPINGECTOMY;  Surgeon: Ena Dawley, MD;  Location: [facility];  Service: Gynecology;  Laterality: Bilateral;   TUBAL LIGATION     WISDOM TOOTH EXTRACTION      Family History  Problem Relation Age of Onset   Hypertension Mother    Hypertension Maternal Grandmother     No Known Allergies  Current Outpatient Medications on File Prior to Visit  Medication Sig Dispense Refill   clonazePAM (KLONOPIN) 0.5 MG  tablet Take 1 tablet (0.5 mg total) by mouth 2 (two) times daily as needed for anxiety. 30 tablet 1   cyclobenzaprine (FLEXERIL) 10 MG tablet Take 1 tablet (10 mg total) by mouth 3 (three) times daily as needed for muscle spasms. 15 tablet 0   ibuprofen (ADVIL) 400 MG tablet TAKE ONE TABLET EVERY 6 HOURS AS NEEDED 90 tablet 1   lisinopril (ZESTRIL) 10 MG tablet Take 1 tablet (10 mg total) by mouth daily. (Patient taking differently: Take 20 mg by mouth daily. ) 90 tablet 3   sertraline (ZOLOFT) 50 MG tablet TAKE 1 TABLET BY MOUTH EVERY DAY 90 tablet 1   No current facility-administered medications on file prior to visit.    BP (!) 150/100 (BP Location: Left Arm, Patient Position: Sitting, Cuff Size: Large)    Pulse 80    Temp 98.7 F (37.1 C) (Oral)    Ht 5' 3.5" (1.613 m)    Wt 241 lb 3.2 oz (109.4 kg)    LMP 01/05/2016 (Approximate)    SpO2 98%    BMI 42.06 kg/m       Objective:   Physical Exam Vitals and nursing note reviewed.  Constitutional:      Appearance: She is well-developed. She is obese.  Cardiovascular:     Rate and Rhythm: Normal rate and regular rhythm.     Pulses: Normal pulses.     Heart sounds: Normal heart sounds.  Pulmonary:     Effort: Pulmonary effort is normal.     Breath sounds: Normal breath sounds.  Abdominal:     General: Abdomen is flat.     Palpations: Abdomen is soft.  Musculoskeletal:        General: Tenderness (Left upper chest wall when laying down.  No tenderness to left upper chest wall with sitting or standing) present. No swelling or deformity. Normal range of motion.  Skin:    General: Skin is warm and dry.     Capillary Refill: Capillary refill takes less than 2 seconds.  Neurological:     General: No focal deficit present.     Mental Status: She is alert and oriented to person, place, and time.  Psychiatric:        Mood and Affect: Mood normal.        Behavior: Behavior normal.        Thought Content: Thought content normal.         Judgment: Judgment normal.       Assessment & Plan:  Her EKG showed normal sinus rhythm with a rate of 71.  Chest pain possibly muscular in nature versus GERD versus hypertensive chest pain.  Refill her lisinopril 20 mg tablets and she was advised to take this every day.  Also send him Prilosec to take daily for 30 days due to GERD-like symptoms.  She was advised to follow-up if no improvement in the next 2 weeks or sooner if symptoms become worse.  Dorothyann Peng, NP

## 2020-08-30 ENCOUNTER — Other Ambulatory Visit: Payer: Self-pay | Admitting: Adult Health

## 2020-08-30 DIAGNOSIS — R079 Chest pain, unspecified: Secondary | ICD-10-CM

## 2020-08-31 ENCOUNTER — Encounter: Payer: Self-pay | Admitting: Adult Health

## 2020-08-31 ENCOUNTER — Ambulatory Visit (INDEPENDENT_AMBULATORY_CARE_PROVIDER_SITE_OTHER): Payer: 59 | Admitting: Adult Health

## 2020-08-31 ENCOUNTER — Other Ambulatory Visit: Payer: Self-pay

## 2020-08-31 VITALS — BP 148/92 | HR 81 | Temp 98.5°F | Ht 63.5 in | Wt 243.4 lb

## 2020-08-31 DIAGNOSIS — K219 Gastro-esophageal reflux disease without esophagitis: Secondary | ICD-10-CM

## 2020-08-31 DIAGNOSIS — I10 Essential (primary) hypertension: Secondary | ICD-10-CM

## 2020-08-31 DIAGNOSIS — F419 Anxiety disorder, unspecified: Secondary | ICD-10-CM

## 2020-08-31 DIAGNOSIS — F32A Depression, unspecified: Secondary | ICD-10-CM | POA: Diagnosis not present

## 2020-08-31 DIAGNOSIS — Z0000 Encounter for general adult medical examination without abnormal findings: Secondary | ICD-10-CM | POA: Diagnosis not present

## 2020-08-31 MED ORDER — OMEPRAZOLE 20 MG PO CPDR: 20.0000 mg | DELAYED_RELEASE_CAPSULE | Freq: Two times a day (BID) | ORAL | 0 refills | Status: AC

## 2020-08-31 MED ORDER — CITALOPRAM HYDROBROMIDE 20 MG PO TABS: 20.0000 mg | ORAL_TABLET | Freq: Every day | ORAL | 1 refills | Status: AC

## 2020-08-31 NOTE — Progress Notes (Signed)
Subjective:    Patient ID: Julie Dunn, female    DOB: 05-31-1976, 44 y.o.   MRN: [id number]  HPI Patient presents for yearly preventative medicine examination. She is a pleasant 44 year old female who  has a past medical history of Allergy, Anemia, Anxiety, Depression, Endometriosis, Headache, Heart murmur, and Shortness of breath dyspnea.  Anxiety/Depression- Feels as though her depression is increasing. Anxiety is good. Would like to go back on Celexa 20 mg as she did well with this medication.   Hypertension-prescribed lisinopril 20 mg daily, she has restarted this medication and is taking it every day but is taking it at inconsistent times. She does not monitor her blood pressure at home. She denies dizziness, lightheadedness, chest pain, shortness of breath, headaches, or syncopal episodes.  BP Readings from Last 3 Encounters:  08/31/20 (!) 148/92  08/01/20 (!) 150/100  08/17/19 (!) 140/98   GERD -currently taking Prilosec 20 mg in the morning.  She does report that this is helped with her GERD-like symptoms but that she does have some lower abdominal pain and diarrhea in the morning that resolves.  Pain is worse on an empty stomach.  All immunizations and health maintenance protocols were reviewed with the patient and needed orders were placed.  Appropriate screening laboratory values were ordered for the patient including screening of hyperlipidemia, renal function and hepatic function.  Medication reconciliation,  past medical history, social history, problem list and allergies were reviewed in detail with the patient  Goals were established with regard to weight loss, exercise, and  diet in compliance with medications.  She has started exercising and trying to eat healthier.  She joined a [employer] and really enjoyed this but finds it expensive so she does not know if she is going to continue, she is looking for a new gym to help her stay active Wt Readings from Last 3  Encounters:  08/31/20 243 lb 6.4 oz (110.4 kg)  08/01/20 241 lb 3.2 oz (109.4 kg)  08/17/19 238 lb 12.8 oz (108.3 kg)    Review of Systems  Constitutional: Negative.   HENT: Negative.   Eyes: Negative.   Respiratory: Negative.   Cardiovascular: Negative.   Gastrointestinal: Positive for abdominal pain and diarrhea.  Endocrine: Negative.   Genitourinary: Negative.   Musculoskeletal: Negative.   Skin: Negative.   Allergic/Immunologic: Negative.   Neurological: Negative.   Hematological: Negative.   Psychiatric/Behavioral: Negative.    Past Medical History:  Diagnosis Date  . Allergy   . Anemia   . Anxiety   . Depression   . Endometriosis   . Headache    Migraines  . Heart murmur    childhood  . Shortness of breath dyspnea    former smoker    Social History   Socioeconomic History  . Marital status: Married    Spouse name: Not on file  . Number of children: Not on file  . Years of education: Not on file  . Highest education level: Not on file  Occupational History  . Not on file  Tobacco Use  . Smoking status: Former Smoker    Packs/day: 0.25    Years: 15.00    Pack years: 3.75    Types: Cigarettes    Quit date: 01/30/2007    Years since quitting: 13.5  . Smokeless tobacco: Never Used  Vaping Use  . Vaping Use: Never used  Substance and Sexual Activity  . Alcohol use: Yes    Alcohol/week: 0.0 standard  drinks  . Drug use: No  . Sexual activity: Yes    Birth control/protection: Surgical  Other Topics Concern  . Not on file  Social History Narrative  . Not on file   Social Determinants of Health   Financial Resource Strain:   . Difficulty of Paying Living Expenses: Not on file  Food Insecurity:   . Worried About Charity fundraiser in the Last Year: Not on file  . Ran Out of Food in the Last Year: Not on file  Transportation Needs:   . Lack of Transportation (Medical): Not on file  . Lack of Transportation (Non-Medical): Not on file  Physical  Activity:   . Days of Exercise per Week: Not on file  . Minutes of Exercise per Session: Not on file  Stress:   . Feeling of Stress : Not on file  Social Connections:   . Frequency of Communication with Friends and Family: Not on file  . Frequency of Social Gatherings with Friends and Family: Not on file  . Attends Religious Services: Not on file  . Active Member of Clubs or Organizations: Not on file  . Attends Archivist Meetings: Not on file  . Marital Status: Not on file  Intimate Partner Violence:   . Fear of Current or Ex-Partner: Not on file  . Emotionally Abused: Not on file  . Physically Abused: Not on file  . Sexually Abused: Not on file    Past Surgical History:  Procedure Laterality Date  . CHOLECYSTECTOMY    . CYSTOSCOPY N/A 02/14/2016   Procedure: CYSTOSCOPY;  Surgeon: Ena Dawley, MD;  Location: [facility];  Service: Gynecology;  Laterality: N/A;  . DIAGNOSTIC LAPAROSCOPY    . LAPAROSCOPIC VAGINAL HYSTERECTOMY WITH SALPINGECTOMY Bilateral 02/14/2016   Procedure: LAPAROSCOPIC ASSISTED VAGINAL HYSTERECTOMY WITH BILATERAL SALPINGECTOMY;  Surgeon: Ena Dawley, MD;  Location: [facility];  Service: Gynecology;  Laterality: Bilateral;  . TUBAL LIGATION    . WISDOM TOOTH EXTRACTION      Family History  Problem Relation Age of Onset  . Hypertension Mother   . Hyperlipidemia Mother   . Hypertension Maternal Grandmother     No Known Allergies  Current Outpatient Medications on File Prior to Visit  Medication Sig Dispense Refill  . ibuprofen (ADVIL) 400 MG tablet TAKE ONE TABLET EVERY 6 HOURS AS NEEDED 90 tablet 1  . lisinopril (ZESTRIL) 20 MG tablet Take 1 tablet (20 mg total) by mouth daily. 90 tablet 1  . omeprazole (PRILOSEC) 20 MG capsule Take 1 capsule (20 mg total) by mouth daily. 30 capsule 0   No current facility-administered medications on file prior to visit.    BP (!) 148/92 (BP Location: Left Arm, Patient Position: Sitting, Cuff Size: Large)    Pulse 81   Temp 98.5 F (36.9 C) (Oral)   Ht 5' 3.5" (1.613 m)   Wt 243 lb 6.4 oz (110.4 kg)   LMP 01/05/2016 (Approximate)   BMI 42.44 kg/m       Objective:   Physical Exam Vitals and nursing note reviewed.  Constitutional:      General: She is not in acute distress.    Appearance: Normal appearance. She is well-developed. She is obese. She is not ill-appearing.  HENT:     Head: Normocephalic and atraumatic.     Right Ear: Tympanic membrane, ear canal and external ear normal. There is no impacted cerumen.     Left Ear: Tympanic membrane, ear canal and external ear normal. There  is no impacted cerumen.     Nose: Nose normal. No congestion or rhinorrhea.     Mouth/Throat:     Mouth: Mucous membranes are moist.     Pharynx: Oropharynx is clear. No oropharyngeal exudate or posterior oropharyngeal erythema.  Eyes:     General:        Right eye: No discharge.        Left eye: No discharge.     Extraocular Movements: Extraocular movements intact.     Conjunctiva/sclera: Conjunctivae normal.     Pupils: Pupils are equal, round, and reactive to light.  Neck:     Thyroid: No thyromegaly.     Vascular: No carotid bruit.     Trachea: No tracheal deviation.  Cardiovascular:     Rate and Rhythm: Normal rate and regular rhythm.     Pulses: Normal pulses.     Heart sounds: Normal heart sounds. No murmur heard.  No friction rub. No gallop.   Pulmonary:     Effort: Pulmonary effort is normal. No respiratory distress.     Breath sounds: Normal breath sounds. No stridor. No wheezing, rhonchi or rales.  Chest:     Chest wall: No tenderness.  Abdominal:     General: Abdomen is flat. Bowel sounds are normal. There is no distension.     Palpations: Abdomen is soft. There is no mass.     Tenderness: There is no abdominal tenderness. There is no right CVA tenderness, left CVA tenderness, guarding or rebound.     Hernia: No hernia is present.  Musculoskeletal:        General: No swelling,  tenderness, deformity or signs of injury. Normal range of motion.     Cervical back: Normal range of motion and neck supple.     Right lower leg: No edema.     Left lower leg: No edema.  Lymphadenopathy:     Cervical: No cervical adenopathy.  Skin:    General: Skin is warm and dry.     Coloration: Skin is not jaundiced or pale.     Findings: No bruising, erythema, lesion or rash.  Neurological:     General: No focal deficit present.     Mental Status: She is alert and oriented to person, place, and time.     Cranial Nerves: No cranial nerve deficit.     Sensory: No sensory deficit.     Motor: No weakness.     Coordination: Coordination normal.     Gait: Gait normal.     Deep Tendon Reflexes: Reflexes normal.  Psychiatric:        Mood and Affect: Mood normal.        Behavior: Behavior normal.        Thought Content: Thought content normal.        Judgment: Judgment normal.       Assessment & Plan:  1. Routine general medical examination at a health care facility -Courage heart healthy diet and aerobic exercise to help lose weight. -Follow-up in 1 year for next CPE - CBC with Differential/Platelet; Future - Hemoglobin A1c; Future - Lipid panel; Future - TSH; Future - CMP with eGFR([facility]); Future  2. Essential hypertension - Blood pressure not at goal in the office today.  -We will have her take her blood pressure medication every night, prior to bed so that she can get on a routine.  She is going to start monitoring her blood pressure at home and bring her log to a  follow-up appointment in 2 weeks - CBC with Differential/Platelet; Future - Hemoglobin A1c; Future - Lipid panel; Future - TSH; Future - CMP with eGFR([facility]); Future  3. Anxiety and depression - Restart on Celexa  - citalopram (CELEXA) 20 MG tablet; Take 1 tablet (20 mg total) by mouth daily.  Dispense: 90 tablet; Refill: 1  4. Gastroesophageal reflux disease without esophagitis - Will increase to BID  dosing  - omeprazole (PRILOSEC) 20 MG capsule; Take 1 capsule (20 mg total) by mouth 2 (two) times daily before a meal.  Dispense: 180 capsule; Refill: 0   Dorothyann Peng, NP

## 2020-08-31 NOTE — Addendum Note (Signed)
Addended by: Marrion Coy on: 08/31/2020 10:51 AM   Modules accepted: Orders

## 2020-08-31 NOTE — Patient Instructions (Addendum)
It was great seeing you today   Please start monitoring your blood pressure at home multiple times a day   Take your blood pressure medication at night   I will see you back in 2 weeks for blood pressure check

## 2020-09-01 LAB — CBC WITH DIFFERENTIAL/PLATELET
Absolute Monocytes: 433 cells/uL (ref 200–950)
Basophils Absolute: 43 cells/uL (ref 0–200)
Basophils Relative: 0.6 %
Eosinophils Absolute: 99 cells/uL (ref 15–500)
Eosinophils Relative: 1.4 %
HCT: 35.8 % (ref 35.0–45.0)
Hemoglobin: 11.1 g/dL — ABNORMAL LOW (ref 11.7–15.5)
Lymphs Abs: 3010 cells/uL (ref 850–3900)
MCH: 23.7 pg — ABNORMAL LOW (ref 27.0–33.0)
MCHC: 31 g/dL — ABNORMAL LOW (ref 32.0–36.0)
MCV: 76.5 fL — ABNORMAL LOW (ref 80.0–100.0)
MPV: 8.7 fL (ref 7.5–12.5)
Monocytes Relative: 6.1 %
Neutro Abs: 3515 cells/uL (ref 1500–7800)
Neutrophils Relative %: 49.5 %
Platelets: 601 10*3/uL — ABNORMAL HIGH (ref 140–400)
RBC: 4.68 10*6/uL (ref 3.80–5.10)
RDW: 14.7 % (ref 11.0–15.0)
Total Lymphocyte: 42.4 %
WBC: 7.1 10*3/uL (ref 3.8–10.8)

## 2020-09-01 LAB — LIPID PANEL
Cholesterol: 190 mg/dL (ref <200)
HDL: 61 mg/dL (ref >=50)
LDL Cholesterol (Calc): 111 mg/dL (calc) — ABNORMAL HIGH
Non-HDL Cholesterol (Calc): 129 mg/dL (calc) (ref <130)
Total CHOL/HDL Ratio: 3.1 (calc) (ref <5.0)
Triglycerides: 85 mg/dL (ref <150)

## 2020-09-01 LAB — COMPLETE METABOLIC PANEL WITH GFR
AG Ratio: 1.1 (calc) (ref 1.0–2.5)
ALT: 14 U/L (ref 6–29)
AST: 13 U/L (ref 10–35)
Albumin: 3.9 g/dL (ref 3.6–5.1)
Alkaline phosphatase (APISO): 103 U/L (ref 31–125)
BUN: 12 mg/dL (ref 7–25)
CO2: 26 mmol/L (ref 20–32)
Calcium: 9.3 mg/dL (ref 8.6–10.2)
Chloride: 103 mmol/L (ref 98–110)
Creat: 0.89 mg/dL (ref 0.50–1.10)
GFR, Est African American: 91 mL/min/{1.73_m2} (ref >=60)
GFR, Est Non African American: 78 mL/min/{1.73_m2} (ref >=60)
Globulin: 3.6 g/dL (calc) (ref 1.9–3.7)
Glucose, Bld: 93 mg/dL (ref 65–99)
Potassium: 4.8 mmol/L (ref 3.5–5.3)
Sodium: 139 mmol/L (ref 135–146)
Total Bilirubin: 0.3 mg/dL (ref 0.2–1.2)
Total Protein: 7.5 g/dL (ref 6.1–8.1)

## 2020-09-01 LAB — HEMOGLOBIN A1C
Hgb A1c MFr Bld: 6 % of total Hgb — ABNORMAL HIGH (ref <5.7)
Mean Plasma Glucose: 126 (calc)
eAG (mmol/L): 7 (calc)

## 2020-09-01 LAB — TSH: TSH: 1.67 mIU/L

## 2020-09-22 ENCOUNTER — Encounter: Payer: Self-pay | Admitting: Adult Health

## 2020-09-22 ENCOUNTER — Ambulatory Visit: Payer: 59 | Admitting: Adult Health

## 2020-09-22 ENCOUNTER — Other Ambulatory Visit: Payer: Self-pay

## 2020-09-22 DIAGNOSIS — I10 Essential (primary) hypertension: Secondary | ICD-10-CM | POA: Diagnosis not present

## 2020-09-22 MED ORDER — LISINOPRIL 30 MG PO TABS: 30.0000 mg | ORAL_TABLET | Freq: Every day | ORAL | 3 refills | Status: AC

## 2020-09-22 NOTE — Progress Notes (Signed)
Subjective:    Patient ID: Julie Dunn, female    DOB: 1976-05-06, 44 y.o.   MRN: [id number]  HPI 44 year old female who  has a past medical history of Allergy, Anemia, Anxiety, Depression, Endometriosis, Headache, Heart murmur, and Shortness of breath dyspnea.  She presents to the office today for three week follow up regarding hypertension. During her last visit she was taking lisinopril daily but at inconsistent times. She was not monitoring her blood pressure at home. She was advised to get into a routine and take her medication at the same time every day and to monitor her BP at home.   Today she reports that she is taking the medication every night and has been monitoring her blood pressures at home. She reports that her blood pressures have improved. Per her log she has readings between 120's-140's/70-80's. With most her readings being in the 323'F systolic.    Review of Systems See HPI   Past Medical History:  Diagnosis Date  . Allergy   . Anemia   . Anxiety   . Depression   . Endometriosis   . Headache    Migraines  . Heart murmur    childhood  . Shortness of breath dyspnea    former smoker    Social History   Socioeconomic History  . Marital status: Married    Spouse name: Not on file  . Number of children: Not on file  . Years of education: Not on file  . Highest education level: Not on file  Occupational History  . Not on file  Tobacco Use  . Smoking status: Former Smoker    Packs/day: 0.25    Years: 15.00    Pack years: 3.75    Types: Cigarettes    Quit date: 01/30/2007    Years since quitting: 13.6  . Smokeless tobacco: Never Used  Vaping Use  . Vaping Use: Never used  Substance and Sexual Activity  . Alcohol use: Yes    Alcohol/week: 0.0 standard drinks  . Drug use: No  . Sexual activity: Yes    Birth control/protection: Surgical  Other Topics Concern  . Not on file  Social History Narrative  . Not on file   Social Determinants of  Health   Financial Resource Strain: Not on file  Food Insecurity: Not on file  Transportation Needs: Not on file  Physical Activity: Not on file  Stress: Not on file  Social Connections: Not on file  Intimate Partner Violence: Not on file    Past Surgical History:  Procedure Laterality Date  . CHOLECYSTECTOMY    . CYSTOSCOPY N/A 02/14/2016   Procedure: CYSTOSCOPY;  Surgeon: Ena Dawley, MD;  Location: [facility];  Service: Gynecology;  Laterality: N/A;  . DIAGNOSTIC LAPAROSCOPY    . LAPAROSCOPIC VAGINAL HYSTERECTOMY WITH SALPINGECTOMY Bilateral 02/14/2016   Procedure: LAPAROSCOPIC ASSISTED VAGINAL HYSTERECTOMY WITH BILATERAL SALPINGECTOMY;  Surgeon: Ena Dawley, MD;  Location: [facility];  Service: Gynecology;  Laterality: Bilateral;  . TUBAL LIGATION    . WISDOM TOOTH EXTRACTION      Family History  Problem Relation Age of Onset  . Hypertension Mother   . Hyperlipidemia Mother   . Hypertension Maternal Grandmother     No Known Allergies  Current Outpatient Medications on File Prior to Visit  Medication Sig Dispense Refill  . citalopram (CELEXA) 20 MG tablet Take 1 tablet (20 mg total) by mouth daily. 90 tablet 1  . ibuprofen (ADVIL) 400 MG tablet TAKE ONE  TABLET EVERY 6 HOURS AS NEEDED 90 tablet 1  . lisinopril (ZESTRIL) 20 MG tablet Take 1 tablet (20 mg total) by mouth daily. 90 tablet 1  . omeprazole (PRILOSEC) 20 MG capsule Take 1 capsule (20 mg total) by mouth 2 (two) times daily before a meal. 180 capsule 0   No current facility-administered medications on file prior to visit.    BP 132/80   Pulse 92   Temp 98.8 F (37.1 C) (Oral)   Ht 5\' 3"  (1.6 m)   Wt 242 lb 3.2 oz (109.9 kg)   LMP 01/05/2016 (Approximate)   SpO2 96%   BMI 42.90 kg/m       Objective:   Physical Exam Vitals and nursing note reviewed.  Constitutional:      Appearance: Normal appearance.  Cardiovascular:     Rate and Rhythm: Normal rate and regular rhythm.     Pulses: Normal pulses.      Heart sounds: Normal heart sounds.  Pulmonary:     Effort: Pulmonary effort is normal.     Breath sounds: Normal breath sounds.  Neurological:     General: No focal deficit present.     Mental Status: She is alert and oriented to person, place, and time.  Psychiatric:        Mood and Affect: Mood normal.        Behavior: Behavior normal.       Assessment & Plan:  1. Essential hypertension - BP better but not at goal. Will increase to 30 mg. Return precautions reviewed  - lisinopril (ZESTRIL) 30 MG tablet; Take 1 tablet (30 mg total) by mouth daily.  Dispense: 90 tablet; Refill: 3   Dorothyann Peng, NP

## 2020-11-26 ENCOUNTER — Other Ambulatory Visit: Payer: Self-pay | Admitting: Adult Health
